# Patient Record
Sex: Female | Born: 1965 | Race: White | Hispanic: No | Marital: Married | Smoking: Never smoker
Health system: Southern US, Community
[De-identification: ages and names within clinical notes are randomized; demographics above are authoritative.]

## PROBLEM LIST (undated history)

## (undated) DIAGNOSIS — D509 Iron deficiency anemia, unspecified: Secondary | ICD-10-CM

## (undated) DIAGNOSIS — K449 Diaphragmatic hernia without obstruction or gangrene: Secondary | ICD-10-CM

## (undated) DIAGNOSIS — E538 Deficiency of other specified B group vitamins: Secondary | ICD-10-CM

## (undated) DIAGNOSIS — D72819 Decreased white blood cell count, unspecified: Secondary | ICD-10-CM

## (undated) DIAGNOSIS — IMO0001 Reserved for inherently not codable concepts without codable children: Secondary | ICD-10-CM

## (undated) DIAGNOSIS — K219 Gastro-esophageal reflux disease without esophagitis: Secondary | ICD-10-CM

## (undated) DIAGNOSIS — E282 Polycystic ovarian syndrome: Secondary | ICD-10-CM

## (undated) HISTORY — DX: Diaphragmatic hernia without obstruction or gangrene: K44.9

## (undated) HISTORY — DX: Decreased white blood cell count, unspecified: D72.819

## (undated) HISTORY — DX: Deficiency of other specified B group vitamins: E53.8

## (undated) HISTORY — DX: Iron deficiency anemia, unspecified: D50.9

## (undated) HISTORY — DX: Polycystic ovarian syndrome: E28.2

## (undated) HISTORY — PX: TONSILLECTOMY: SUR1361

---

## 1993-07-17 HISTORY — PX: TMJ ARTHROSCOPY: SHX1067

## 2000-04-09 ENCOUNTER — Ambulatory Visit (HOSPITAL_COMMUNITY): Admission: RE | Admit: 2000-04-09 | Discharge: 2000-04-09 | Payer: Self-pay | Admitting: Obstetrics and Gynecology

## 2000-04-09 ENCOUNTER — Encounter: Payer: Self-pay | Admitting: Obstetrics and Gynecology

## 2005-11-10 ENCOUNTER — Ambulatory Visit (HOSPITAL_COMMUNITY): Admission: RE | Admit: 2005-11-10 | Discharge: 2005-11-10 | Payer: Self-pay | Admitting: Gynecology

## 2005-11-10 ENCOUNTER — Ambulatory Visit: Payer: Self-pay | Admitting: Gynecology

## 2005-11-27 ENCOUNTER — Ambulatory Visit: Payer: Self-pay | Admitting: Gynecology

## 2005-12-18 ENCOUNTER — Ambulatory Visit: Payer: Self-pay | Admitting: Gynecology

## 2005-12-19 ENCOUNTER — Ambulatory Visit: Payer: Self-pay | Admitting: Gynecology

## 2006-01-22 ENCOUNTER — Ambulatory Visit: Payer: Self-pay | Admitting: Gynecology

## 2006-02-19 ENCOUNTER — Ambulatory Visit: Payer: Self-pay | Admitting: Gynecology

## 2006-02-19 ENCOUNTER — Ambulatory Visit (HOSPITAL_COMMUNITY): Admission: RE | Admit: 2006-02-19 | Discharge: 2006-02-19 | Payer: Self-pay | Admitting: Gynecology

## 2006-03-12 ENCOUNTER — Ambulatory Visit: Payer: Self-pay | Admitting: Gynecology

## 2006-03-29 ENCOUNTER — Ambulatory Visit: Payer: Self-pay | Admitting: Obstetrics & Gynecology

## 2006-04-02 ENCOUNTER — Ambulatory Visit: Payer: Self-pay | Admitting: Obstetrics & Gynecology

## 2006-04-17 ENCOUNTER — Ambulatory Visit: Payer: Self-pay | Admitting: Family Medicine

## 2006-05-01 ENCOUNTER — Ambulatory Visit: Payer: Self-pay | Admitting: Family Medicine

## 2006-05-10 ENCOUNTER — Ambulatory Visit (HOSPITAL_COMMUNITY): Admission: RE | Admit: 2006-05-10 | Discharge: 2006-05-10 | Payer: Self-pay | Admitting: Gynecology

## 2006-05-15 ENCOUNTER — Ambulatory Visit: Payer: Self-pay | Admitting: Family Medicine

## 2006-05-29 ENCOUNTER — Ambulatory Visit: Payer: Self-pay | Admitting: Family Medicine

## 2006-06-12 ENCOUNTER — Ambulatory Visit: Payer: Self-pay | Admitting: Family Medicine

## 2006-06-19 ENCOUNTER — Ambulatory Visit: Payer: Self-pay | Admitting: Family Medicine

## 2006-06-26 ENCOUNTER — Ambulatory Visit: Payer: Self-pay | Admitting: Family Medicine

## 2006-06-29 ENCOUNTER — Inpatient Hospital Stay (HOSPITAL_COMMUNITY): Admission: AD | Admit: 2006-06-29 | Discharge: 2006-07-02 | Payer: Self-pay | Admitting: Family Medicine

## 2006-06-29 ENCOUNTER — Ambulatory Visit: Payer: Self-pay | Admitting: Family Medicine

## 2006-08-14 ENCOUNTER — Ambulatory Visit: Payer: Self-pay | Admitting: Family Medicine

## 2007-07-18 HISTORY — PX: ESOPHAGOGASTRODUODENOSCOPY: SHX1529

## 2007-07-18 HISTORY — PX: COLONOSCOPY: SHX174

## 2007-10-31 ENCOUNTER — Ambulatory Visit: Payer: Self-pay | Admitting: Unknown Physician Specialty

## 2007-11-15 ENCOUNTER — Ambulatory Visit: Payer: Self-pay | Admitting: Internal Medicine

## 2007-12-16 ENCOUNTER — Ambulatory Visit: Payer: Self-pay | Admitting: Internal Medicine

## 2008-01-15 ENCOUNTER — Ambulatory Visit: Payer: Self-pay | Admitting: Internal Medicine

## 2008-01-29 HISTORY — PX: BONE MARROW BIOPSY: SHX199

## 2008-02-15 ENCOUNTER — Ambulatory Visit: Payer: Self-pay | Admitting: Internal Medicine

## 2008-04-07 ENCOUNTER — Ambulatory Visit: Payer: Self-pay | Admitting: Internal Medicine

## 2008-04-16 ENCOUNTER — Ambulatory Visit: Payer: Self-pay | Admitting: Internal Medicine

## 2008-04-19 ENCOUNTER — Ambulatory Visit: Payer: Self-pay | Admitting: Family Medicine

## 2008-09-14 ENCOUNTER — Ambulatory Visit: Payer: Self-pay | Admitting: Internal Medicine

## 2008-09-28 ENCOUNTER — Ambulatory Visit: Payer: Self-pay | Admitting: Internal Medicine

## 2008-10-15 ENCOUNTER — Ambulatory Visit: Payer: Self-pay | Admitting: Internal Medicine

## 2008-11-14 ENCOUNTER — Ambulatory Visit: Payer: Self-pay | Admitting: Internal Medicine

## 2008-11-25 ENCOUNTER — Ambulatory Visit: Payer: Self-pay | Admitting: Internal Medicine

## 2008-12-15 ENCOUNTER — Ambulatory Visit: Payer: Self-pay | Admitting: Internal Medicine

## 2009-01-06 IMAGING — US TRANSABDOMINAL ULTRASOUND OF PELVIS
1 series · 17 of 25 positions shown · non-contrast
Comparison: none

REASON FOR EXAM: right adnexal mass seen on CT
COMMENTS:

[Series 1: transabdominal ultrasound of pelvis · 17 of 34 slices shown]
[im 1/34]
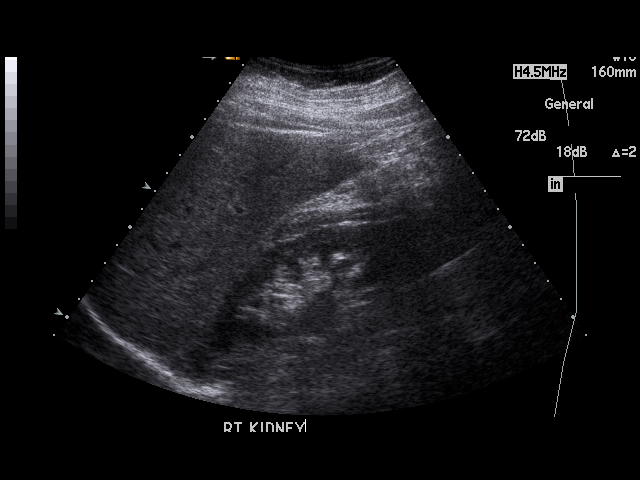
[im 3/34]
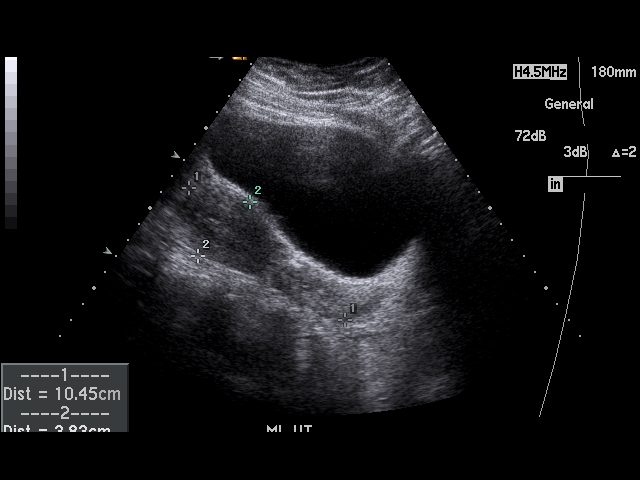
[im 5/34]
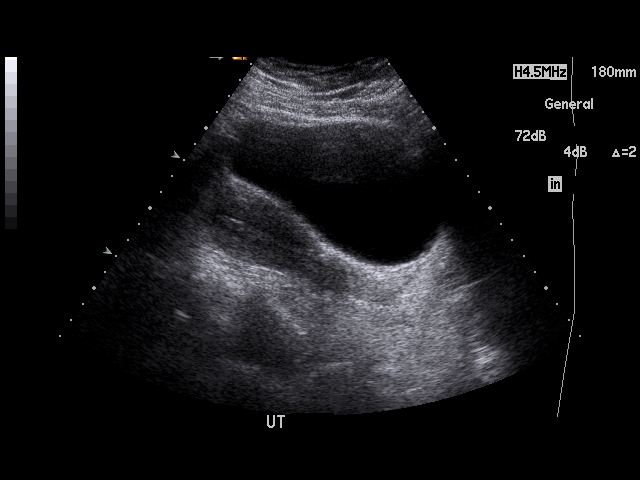
[im 7/34]
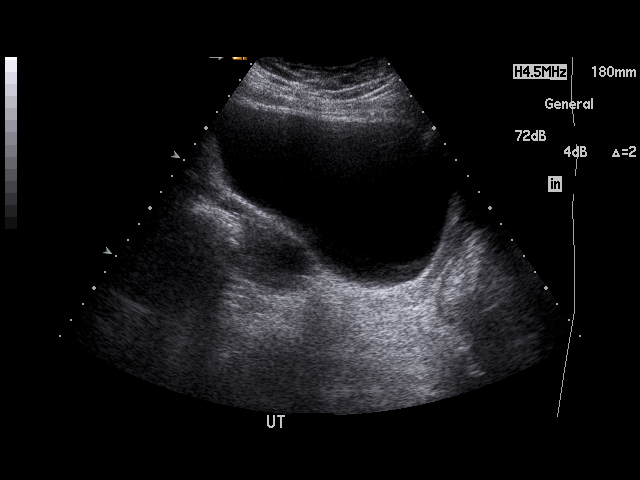
[im 9/34]
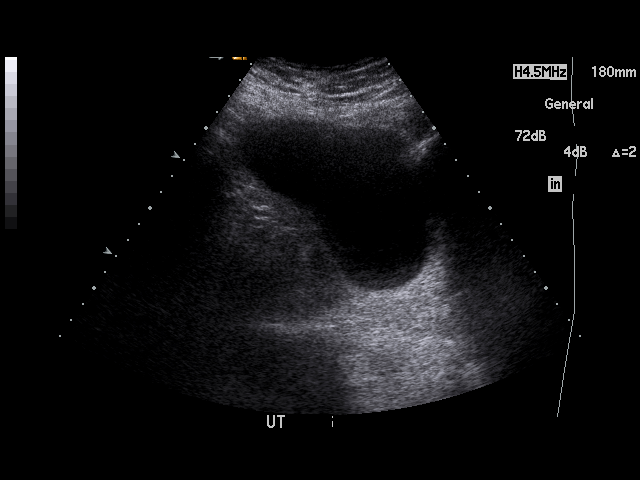
[im 12/34]
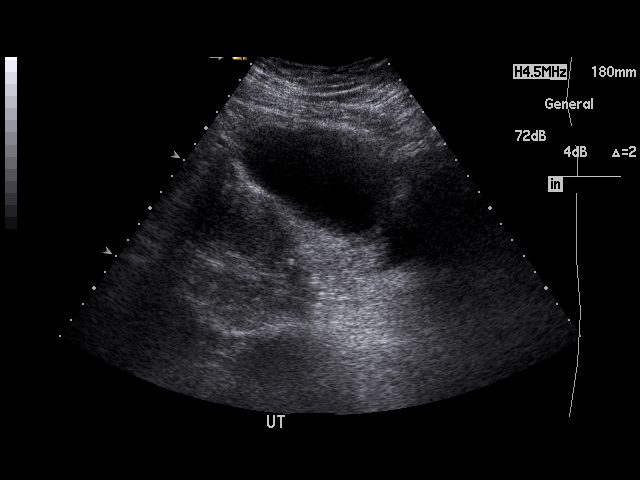
[im 13/34]
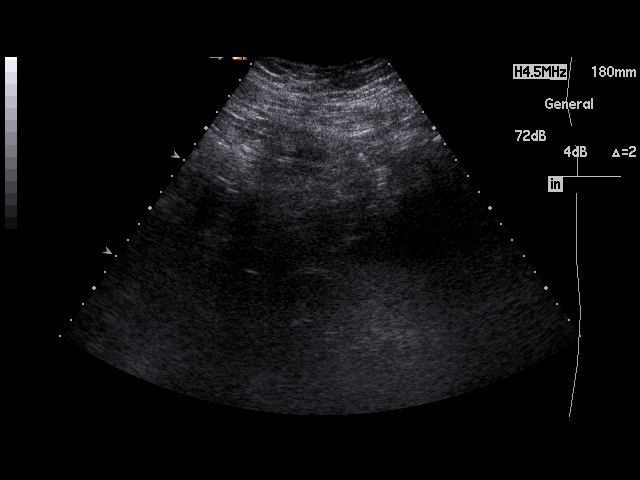
[im 16/34]
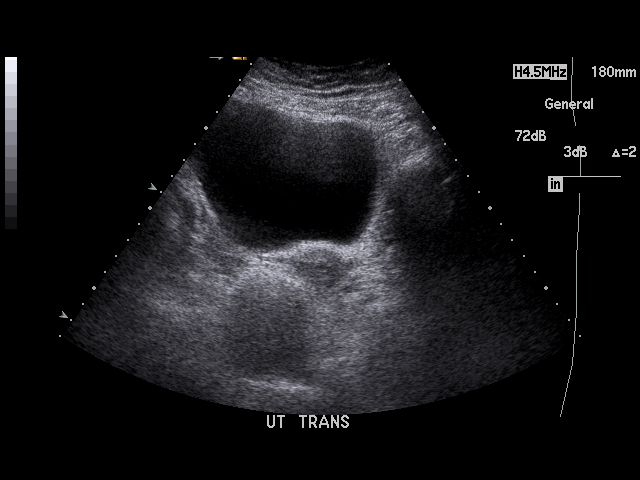
[im 17/34]
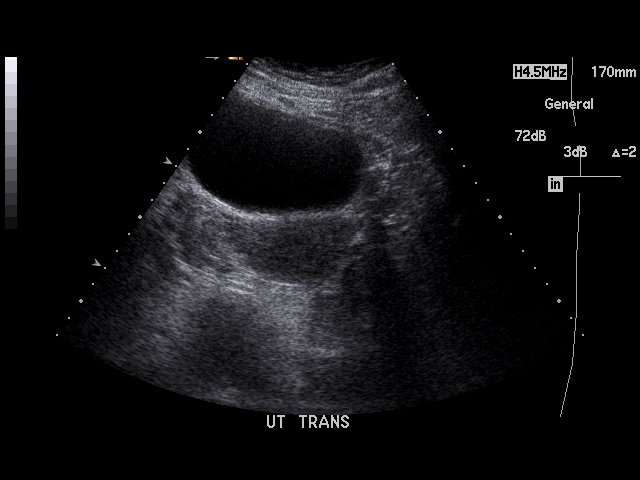
[im 18/34]
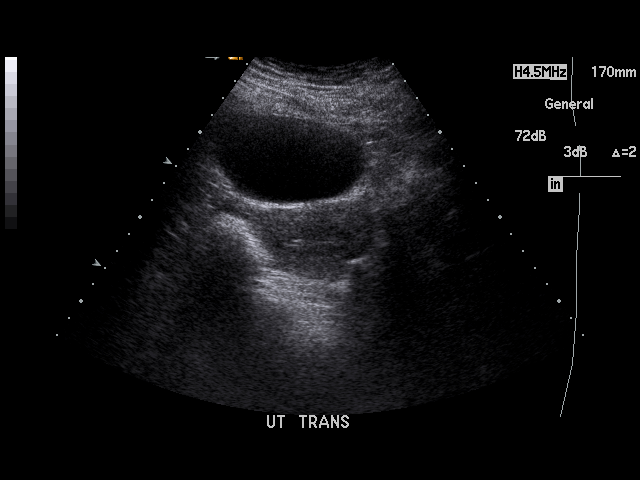
[im 21/34]
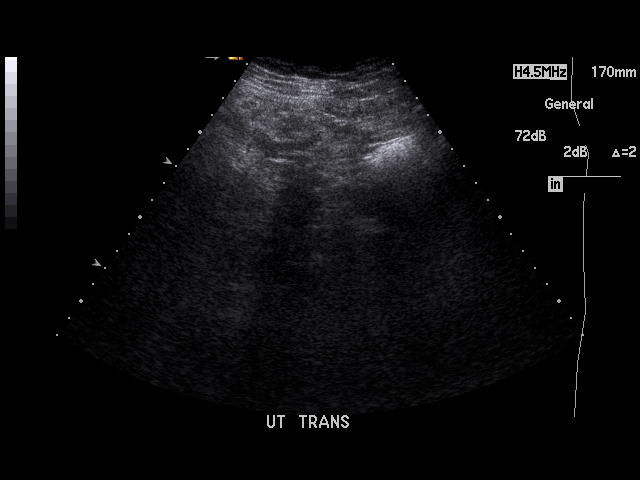
[im 23/34]
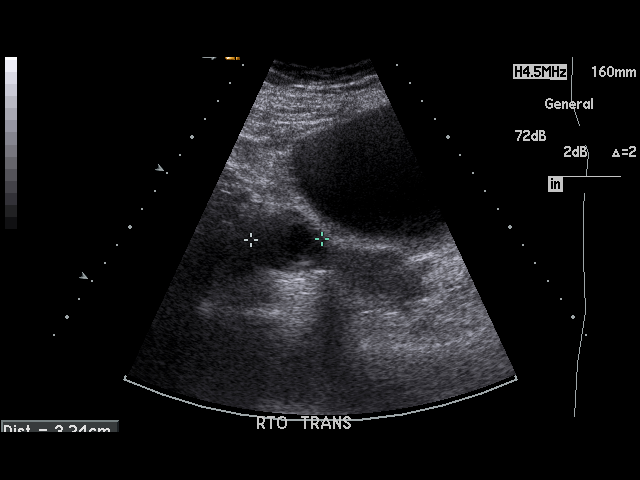
[im 25/34]
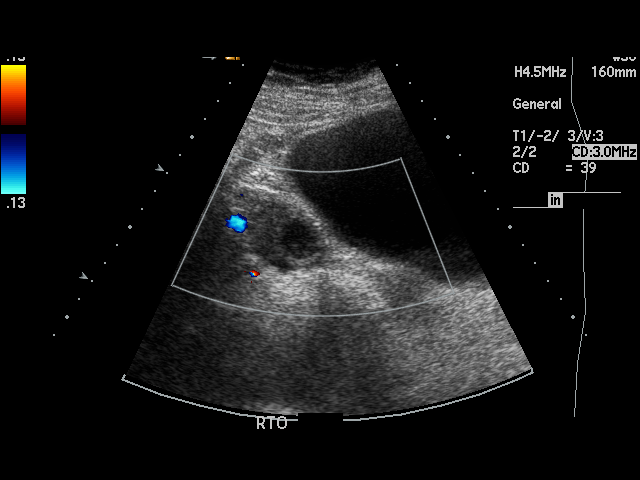
[im 27/34]
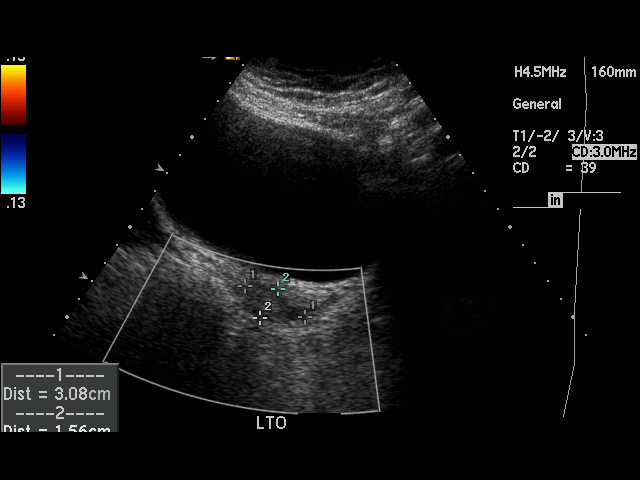
[im 29/34]
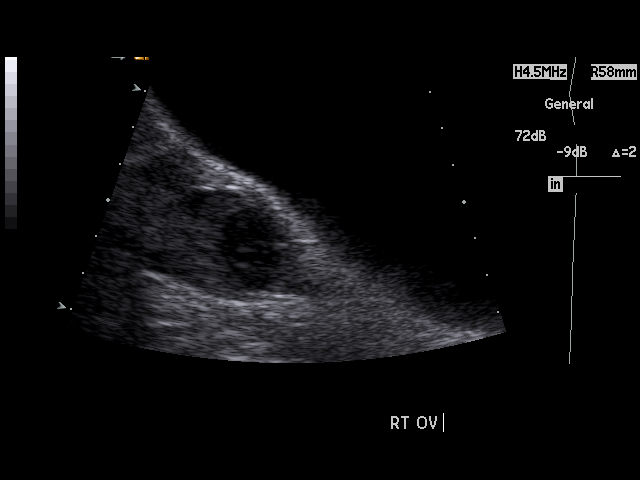
[im 31/34]
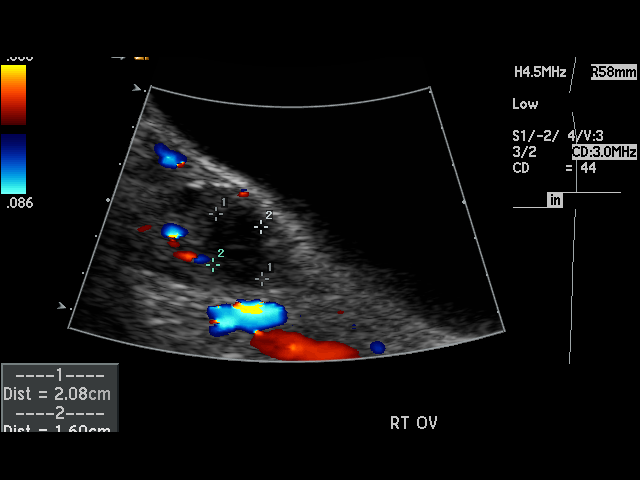
[im 34/34]
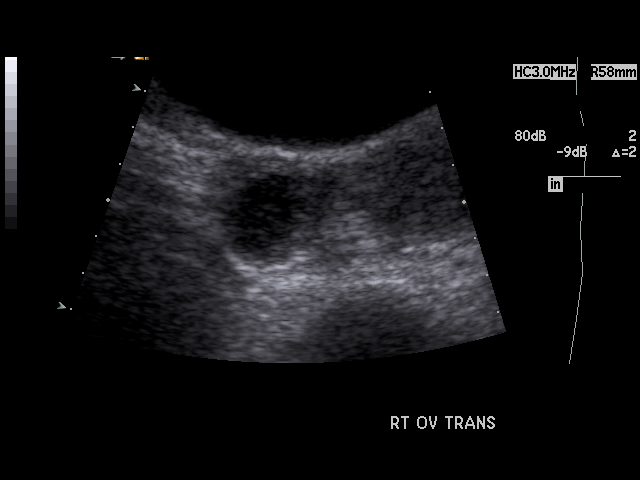

[17 of 25 positions shown; findings below may reference images not displayed]

PROCEDURE:     US  - US PELVIS MASS EXAM  - [DATE] [DATE] [DATE]  [DATE]

RESULT:     Pelvic sonogram performed transabdominally demonstrates the
uterus measures 10.5 x 3.8 x 5.1 cm. The endometrial stripe thickness is
mm. No definite mass is seen within the uterus. There is blood flow to both
ovaries. The RIGHT ovary is enlarged to 4.3 x 2.6 x 3.2 cm. The LEFT ovary
measures 3.1 x 1.6 x 3.2 cm. There is a roughly 2.0 x 1.6 x 1.5 cm lesion
involving the RIGHT ovary containing some low level internal echoes
suggestive of a possible hemorrhagic cyst. There is no evidence of adnexal
mass or significant free fluid.
IMPRESSION: 1.Possible hemorrhagic cyst in the RIGHT ovary. The uterus is slightly
prominent.

## 2009-01-14 ENCOUNTER — Ambulatory Visit: Payer: Self-pay | Admitting: Internal Medicine

## 2009-02-09 ENCOUNTER — Ambulatory Visit: Payer: Self-pay | Admitting: Obstetrics & Gynecology

## 2009-02-09 ENCOUNTER — Encounter: Payer: Self-pay | Admitting: Obstetrics & Gynecology

## 2009-02-14 ENCOUNTER — Ambulatory Visit: Payer: Self-pay | Admitting: Internal Medicine

## 2009-03-17 ENCOUNTER — Ambulatory Visit: Payer: Self-pay | Admitting: Internal Medicine

## 2009-04-16 ENCOUNTER — Ambulatory Visit: Payer: Self-pay | Admitting: Internal Medicine

## 2009-05-17 ENCOUNTER — Ambulatory Visit: Payer: Self-pay | Admitting: Internal Medicine

## 2009-07-14 ENCOUNTER — Ambulatory Visit: Payer: Self-pay | Admitting: Internal Medicine

## 2009-07-17 ENCOUNTER — Ambulatory Visit: Payer: Self-pay | Admitting: Internal Medicine

## 2009-08-17 ENCOUNTER — Ambulatory Visit: Payer: Self-pay | Admitting: Internal Medicine

## 2009-09-09 ENCOUNTER — Ambulatory Visit: Payer: Self-pay | Admitting: Internal Medicine

## 2009-09-14 ENCOUNTER — Ambulatory Visit: Payer: Self-pay | Admitting: Internal Medicine

## 2009-10-27 ENCOUNTER — Ambulatory Visit: Payer: Self-pay | Admitting: Internal Medicine

## 2010-08-31 ENCOUNTER — Ambulatory Visit: Payer: Self-pay | Admitting: Internal Medicine

## 2010-09-05 ENCOUNTER — Ambulatory Visit: Payer: Self-pay | Admitting: Unknown Physician Specialty

## 2010-09-15 ENCOUNTER — Ambulatory Visit: Payer: Self-pay | Admitting: Internal Medicine

## 2010-10-06 ENCOUNTER — Ambulatory Visit: Payer: Self-pay | Admitting: Unknown Physician Specialty

## 2010-10-16 ENCOUNTER — Ambulatory Visit: Payer: Self-pay | Admitting: Internal Medicine

## 2010-11-15 ENCOUNTER — Ambulatory Visit: Payer: Self-pay | Admitting: Internal Medicine

## 2010-11-29 NOTE — Assessment & Plan Note (Signed)
NAME:  Leslie Berg, BULTEMA               ACCOUNT NO.:  [id number]   MEDICAL RECORD NO.:  [id number]          PATIENT TYPE:  POB   LOCATION:  [facility] at [facility]         FACILITY:  [facility]   PHYSICIAN:  Jaynie Collins, MD     DATE OF BIRTH:  April 12, 1966   DATE OF SERVICE:                                  CLINIC NOTE   The patient comes to the office today for 2 problems; the first is of  irregular menstrual cycle and the second is that of some knots under her  left armpit.  We will start with the irregular menstrual cycles.  She  has a very long history of PCOS.  She was diagnosed in 2001.  She was on  Glucophage at one point.  She has also been on Clomid several times.  She does have infertility issues.  She did  have a child 2-1/2 years  ago.  She is currently using condoms for birth control.  She describes  her menstrual cycle prior to the last 2 months as coming every 6 weeks.  She has now states that her menstrual cycle is every 3 weeks for the  last 2 months only.  Her cycle last for 5-6 days.  She has 1-2 days of  heavy bleeding or she changes her pads 3-4 times per day.  She has 2  days of moderate bleeding, where she changes her pad 2-3 times a day and  she has 2 mild days when she changes her pad once or twice a day more  for hygiene.  Again this has only gone on for the last 2 months.  In the  past year, she has had an extensive workup for anemia.  She has had a  complete GI workup from the [facility] as well as a CT and  ultrasound of her abdomen and pelvis.  She has been given approximately  10 iron infusions.  Her hemoglobin has risen somewhat.  There has been  no clear-cut explanation for the anemia.  In the meantime, she has not  had a Pap smears in 2007.  She has never had an abnormal Pap smear.  The  second problem is that of these knots have developed under her arm.  She  is also having some of these areas of infection and other parts of her  body.  She has had them on  her arms and on her legs.  She is currently  seeing a Infectious Disease doctor for this.  It was not diagnosed as  MRSA, but diagnosed as a staph infection.  She currently does not have  these areas under her arm or any place on her skin at this point.   PHYSICAL EXAMINATION:  GENERAL:  Well-developed, well-nourished,  overweight 45 year old Caucasian female in no acute distress.  VITAL SIGNS:  Blood pressure is 122/83, pulse is 73, weight is 191,  height is 5 feet 1.  BREASTS:  Symmetrical.  No skin dimpling.  No mass appreciated.  No  axillae or lymphadenopathy or break in skin.  PELVIC:  Internally, the vault has a very small amount of blood.  The  cervix is closed.  She has a  nullip cervix.  She has no cervical motion  tenderness.  ABDOMEN:  Quite obese, but there was no cervical motion tenderness or  adnexal mass appreciated.   ASSESSMENT:  1. Irregular menstrual cycle.  2. Infections on the skin.   PLAN:  Today is to discuss this with Dr. Macon Large and she felt that it  would be prudent to offer this lady a birth control that may help to  suppress her menstrual cycle in the event that this patient feels that  the menstrual cycle is heavy and bothersome.  The patient does not feel  the pad is necessary at this time.  We will also do her Pap smear  today as well as her screening mammogram.  She has not had a mammogram.  The patient is asked to return if her menstrual cycle worsens or if  there are any further problems.  Otherwise, she will return in 1 year.      [name], NP    ______________________________  Jaynie Collins, MD    LR/MEDQ  D:  02/09/2009  T:  02/10/2009  Job:  [id number]

## 2010-12-02 NOTE — Discharge Summary (Signed)
NAMEYIDES, Leslie Berg               ACCOUNT NO.:  [id number]   MEDICAL RECORD NO.:  [id number]          PATIENT TYPE:  INP   LOCATION:  9107                          FACILITY:  [facility]   PHYSICIAN:  Levander Campion, M.D.  DATE OF BIRTH:  07/14/1966   DATE OF ADMISSION:  06/29/2006  DATE OF DISCHARGE:  07/02/2006                               DISCHARGE SUMMARY   DISCHARGE DIAGNOSES:  1. Status post repeat, low transverse cesarean section.  2. Delivery of a viable female infant.  3. Advanced maternal age.   DISCHARGE MEDICATIONS:  1. Percocet 5/325 one to two tabs p.o. q.4-6 h. p.r.n. pain.  2. Ibuprofen 600 mg 1 tab q.6 h. p.r.n. pain.  3. Prenatal vitamins 1 p.o. daily while breast feeding.  4. Colace 100 mg p.o. b.i.d. p.r.n. for postpartum constipation.   DISCHARGE LABS:  Hemoglobin was 8.6, hematocrit 24.6 and that is  interoperative, from preop H&H 9.6/27.7.  The patient's blood type is A  positive, antibody negative, and the patient is rubella immune.   HOSPITAL COURSE:  Ms. Sobh is a 45 year old G8 P1-0-6-1 who was  admitted for scheduled repeat low transverse cesarean section.  Please  see operative report for details. Her postop course was unremarkable.  Her hemoglobin did drop from 9.6 to 8.6; however, she was asymptomatic  throughout her postop course reporting no dizziness, lightheadness or  weakness.  She is breast feeding successfully.  Her infant received a  circumcision.  She is undecided on what she would like to use for  contraception.  She will decide that at her 6-week postpartum  appointment at [facility].  Otherwise postop course unremarkable with  no complications.   DISCHARGE MEDICATIONS:  The patient was discharged home with adequate  pain medication, prenatal vitamins, and Colace.   FOLLOWUP:  1. She is to followup in 6 weeks at [facility] for postpartum      appointment.  She is call the [facility] for an      appointment to have her  staples removed in two days on postop day      #5.  She is to have pelvic rest x6 weeks and no heavy lifting x6      weeks.  2. The patient is to followup in 6 weeks with [facility] for      postpartum visit and she is to call the [facility] in 2      days for staple removal.           ______________________________  Levander Campion, M.D.     JH/MEDQ  D:  07/02/2006  T:  07/02/2006  Job:  [id number]

## 2010-12-02 NOTE — Op Note (Signed)
NAMESADAKO, CEGIELSKI               ACCOUNT NO.:  [id number]   MEDICAL RECORD NO.:  [id number]          PATIENT TYPE:  INP   LOCATION:                                FACILITY:  [facility]   PHYSICIAN:  Tanya S. Shawnie Pons, M.D.   DATE OF BIRTH:  June 09, 1966   DATE OF PROCEDURE:  06/29/2006  DATE OF DISCHARGE:                               OPERATIVE REPORT   PREOPERATIVE DIAGNOSIS:  Intrauterine pregnancy at 39 weeks, previous  cesarean section.   POSTOPERATIVE DIAGNOSIS:  Intrauterine pregnancy at 39 weeks, previous  cesarean section.   PROCEDURE:  Repeat low transverse Cesarean section.   SURGEON:  Shelbie Proctor. Shawnie Pons, M.D.   ASSISTANT:  Paticia Stack, M.D.   ANESTHESIA:  Spinal.   SPECIMENS:  Placenta sent to labor and delivery.   ESTIMATED BLOOD LOSS:  750 mL.   COMPLICATIONS:  None.   FINDINGS:  Viable female infant, Apgars 9 and 9 at one and five minutes,  respectively. Birth weight 8 pounds 14 ounces.   REASON FOR PROCEDURE:  This is a 45 year old gravida 8, para 1-0-6-1 at  39-weeks' gestation who describes an elective repeat cesarean section.   PROCEDURE:  The patient was taken to the operating room where her spinal  anesthesia was found to be adequate. She was then prepped and draped in  a normal sterile fashion in a dorsal supine position with a leftward  tilt ___________ skin incision was then made with a scalpel and carried  through to the underlying layer of fascia. The fascia was incised in the  midline and the incision extended laterally with a Mayo scissors. The  superior aspect of the fascial incision was then grasped with a Kocher  clamp and elevated, and the underlying rectus muscles dissected off  bluntly. There are a lot of adhesions noted and dissected. Attention was  then turned to the inferior aspect of the incision which in a similar  fashion was grasped, tented up with the Kocher clamp, and the rectus  muscle was dissected off bluntly. The rectus muscles were  separated in  the midline. The peritoneum was identified, tented up and entered with  the Metzenbaum scissors. There were again noted to be a lot of  adhesions, and the bladder was found to be quite high. The adhesions  were dissected, and the bladder blade was inserted. The lower uterine  segment was identified and noted to be very thin. It was incised in a  transfer fashion with a scalpel. The bladder blade was removed, and the  infant's head delivered atraumatically. The nose and mouth were  suctioned, and the cord clamped and cut. The infant was handed off to  the waiting pediatrician. Cord blood was obtained. The placenta was then  removed manually, the uterus exteriorized and cleared of all clots and  debris. The uterine incision was repaired with 1-0 chromic in a running  locked fashion. The uterus was returned to the abdomen, and the incision  examined for signs of bleeding. Pressure was applied to the uterine  incision with good hemostasis. The gutters were cleared of all  clots and debris, and the fascia was reapproximated with 0 Vicryl in a  running fashion. The skin was closed with staples. The patient tolerated  the procedure well. Sponge, lap and needle counts were correct x2. One  gram of Ancef was given after cord clamp. The patient was taken to the  recovery room in stable condition.     ______________________________  Paticia Stack, MD    ______________________________  Shelbie Proctor. Shawnie Pons, M.D.    LNJ/MEDQ  D:  06/29/2006  T:  06/29/2006  Job:  [id number]

## 2010-12-02 NOTE — Assessment & Plan Note (Signed)
NAME:  MALEAHA, HUGHETT NO.:  [id number]   MEDICAL RECORD NO.:  [id number]          PATIENT TYPE:  POB   LOCATION:  [facility]         FACILITY:  [facility]   PHYSICIAN:  Tinnie Gens, MD        DATE OF BIRTH:  06-24-1966   DATE OF SERVICE:                                  CLINIC NOTE   Ms. Visscher is a 45 year old female, gravida 8, AB 6, para 2, who  presents for her postpartum examination.  She delivered by C-section,  repeat, on June 29, 2006, by Dr. Shawnie Pons.  She delivered a viable female  whose name is [employer] cabin [employer].  She reports that things are going quite well as far  as she is concerned.  She says she is not having any depression and is  happy.  She states she is no longer bleeding.  She has not attempted  intercourse at this point.  The baby is a little colicky and is being  followed by [facility] for not quite back up to birth weight.  His birth weight was approximately 8 1/2 pounds.  Her weight, however,  is only 3 pounds from her pre-pregnant weight.   PHYSICAL EXAMINATION:  Her abdomen is soft and tender, well healed  incision.  Pelvic exam reveals external genitalia within normal limits  for female.  Vagina is clean and rugose.  The cervix is multiparous and  clean.  The uterus is back to normal size, shape, and contour.  The  adnexa are bilaterally clear.  There does not seem to be any problems  with hemorrhoids.   ASSESSMENT:  Normal postpartum exam.   PLAN:  She is to continue her prenatal vitamins as long as she nurses.  Pamphlets were given on both the [place] and the ParaGard IUD for  consideration at the time of her annual exam and Pap smear which are due  in May 2008.  She is to call for any problems or consideration of  earlier than stated insertion of her IUD.  In the meantime, she will use  foam and condoms for birth control.     ______________________________  Matt Holmes, N.P.    ______________________________  Tinnie Gens,  MD    EMK/MEDQ  D:  08/14/2006  T:  08/14/2006  Job:  [id number]

## 2010-12-19 ENCOUNTER — Inpatient Hospital Stay: Payer: Self-pay | Admitting: Internal Medicine

## 2010-12-19 ENCOUNTER — Ambulatory Visit: Payer: Self-pay | Admitting: Unknown Physician Specialty

## 2010-12-20 LAB — PATHOLOGY REPORT

## 2011-02-15 ENCOUNTER — Ambulatory Visit: Payer: Self-pay | Admitting: Internal Medicine

## 2011-03-18 ENCOUNTER — Ambulatory Visit: Payer: Self-pay | Admitting: Internal Medicine

## 2011-06-07 ENCOUNTER — Ambulatory Visit: Payer: Self-pay | Admitting: Internal Medicine

## 2011-06-17 ENCOUNTER — Ambulatory Visit: Payer: Self-pay | Admitting: Internal Medicine

## 2011-10-10 ENCOUNTER — Ambulatory Visit: Payer: Self-pay | Admitting: Internal Medicine

## 2011-10-10 LAB — CBC CANCER CENTER
Basophil #: 0 x10 3/mm (ref 0.0–0.1)
Basophil %: 0.7 %
Eosinophil #: 0.1 x10 3/mm (ref 0.0–0.7)
Eosinophil %: 2 %
HCT: 37.4 % (ref 35.0–47.0)
HGB: 13.2 g/dL (ref 12.0–16.0)
Lymphocyte #: 1.8 x10 3/mm (ref 1.0–3.6)
Lymphocyte %: 34.7 %
MCH: 30.5 pg (ref 26.0–34.0)
MCHC: 35.3 g/dL (ref 32.0–36.0)
MCV: 86 fL (ref 80–100)
Monocyte #: 0.4 x10 3/mm (ref 0.0–0.7)
Monocyte %: 7 %
Neutrophil #: 2.9 x10 3/mm (ref 1.4–6.5)
Neutrophil %: 55.6 %
Platelet: 281 x10 3/mm (ref 150–440)
RBC: 4.32 10*6/uL (ref 3.80–5.20)
RDW: 13.3 % (ref 11.5–14.5)
WBC: 5.1 x10 3/mm (ref 3.6–11.0)

## 2011-10-10 LAB — IRON AND TIBC
Iron Bind.Cap.(Total): 319 ug/dL (ref 250–450)
Iron Saturation: 25 %
Iron: 80 ug/dL (ref 50–170)

## 2011-10-10 LAB — FERRITIN: Ferritin (ARMC): 60 ng/mL (ref 8–388)

## 2011-10-16 ENCOUNTER — Ambulatory Visit: Payer: Self-pay | Admitting: Internal Medicine

## 2012-09-06 ENCOUNTER — Ambulatory Visit: Payer: Self-pay | Admitting: Internal Medicine

## 2012-09-18 ENCOUNTER — Ambulatory Visit: Payer: Self-pay | Admitting: Internal Medicine

## 2012-09-19 LAB — CBC CANCER CENTER
Basophil #: 0 x10 3/mm (ref 0.0–0.1)
Eosinophil #: 0.1 x10 3/mm (ref 0.0–0.7)
Eosinophil %: 1.4 %
Lymphocyte #: 1.5 x10 3/mm (ref 1.0–3.6)
Lymphocyte %: 28.6 %
MCV: 81 fL (ref 80–100)
Monocyte #: 0.3 x10 3/mm (ref 0.2–0.9)
Monocyte %: 5.2 %
Neutrophil %: 64.3 %
Platelet: 214 x10 3/mm (ref 150–440)
RBC: 4.28 10*6/uL (ref 3.80–5.20)
RDW: 14.3 % (ref 11.5–14.5)
WBC: 5.3 x10 3/mm (ref 3.6–11.0)

## 2012-09-19 LAB — IRON AND TIBC
Iron Bind.Cap.(Total): 371 ug/dL (ref 250–450)
Iron Saturation: 17 %
Iron: 62 ug/dL (ref 50–170)
Unbound Iron-Bind.Cap.: 309 ug/dL

## 2012-10-15 ENCOUNTER — Ambulatory Visit: Payer: Self-pay | Admitting: Internal Medicine

## 2013-01-15 ENCOUNTER — Ambulatory Visit: Payer: Self-pay | Admitting: Internal Medicine

## 2013-05-08 ENCOUNTER — Ambulatory Visit: Payer: Self-pay | Admitting: Internal Medicine

## 2013-05-14 ENCOUNTER — Ambulatory Visit: Payer: Self-pay | Admitting: Unknown Physician Specialty

## 2013-06-05 ENCOUNTER — Ambulatory Visit: Payer: Self-pay

## 2013-06-05 LAB — PREGNANCY, URINE: Pregnancy Test, Urine: NEGATIVE m[IU]/mL

## 2013-07-28 ENCOUNTER — Ambulatory Visit: Payer: Self-pay | Admitting: Internal Medicine

## 2013-07-28 LAB — CBC CANCER CENTER
Basophil #: 0 x10 3/mm (ref 0.0–0.1)
Basophil %: 0.8 %
EOS PCT: 1.3 %
Eosinophil #: 0.1 x10 3/mm (ref 0.0–0.7)
HCT: 29.7 % — ABNORMAL LOW (ref 35.0–47.0)
HGB: 9.2 g/dL — AB (ref 12.0–16.0)
Lymphocyte #: 1.8 x10 3/mm (ref 1.0–3.6)
Lymphocyte %: 36 %
MCH: 20.8 pg — AB (ref 26.0–34.0)
MCHC: 31.1 g/dL — AB (ref 32.0–36.0)
MCV: 67 fL — ABNORMAL LOW (ref 80–100)
MONOS PCT: 9 %
Monocyte #: 0.5 x10 3/mm (ref 0.2–0.9)
NEUTROS ABS: 2.7 x10 3/mm (ref 1.4–6.5)
Neutrophil %: 52.9 %
Platelet: 262 x10 3/mm (ref 150–440)
RBC: 4.42 10*6/uL (ref 3.80–5.20)
RDW: 16.7 % — ABNORMAL HIGH (ref 11.5–14.5)
WBC: 5.1 x10 3/mm (ref 3.6–11.0)

## 2013-07-28 LAB — IRON AND TIBC
Iron Bind.Cap.(Total): 453 ug/dL — ABNORMAL HIGH (ref 250–450)
Iron Saturation: 4 %
Iron: 20 ug/dL — ABNORMAL LOW (ref 50–170)
UNBOUND IRON-BIND. CAP.: 433 ug/dL

## 2013-07-28 LAB — FERRITIN: Ferritin (ARMC): 4 ng/mL — ABNORMAL LOW (ref 8–388)

## 2013-08-17 ENCOUNTER — Ambulatory Visit: Payer: Self-pay | Admitting: Internal Medicine

## 2013-10-23 ENCOUNTER — Ambulatory Visit: Payer: Self-pay | Admitting: Internal Medicine

## 2013-10-24 LAB — CBC CANCER CENTER
BASOS ABS: 0 x10 3/mm (ref 0.0–0.1)
Basophil %: 0.7 %
EOS ABS: 0.1 x10 3/mm (ref 0.0–0.7)
Eosinophil %: 1.3 %
HCT: 37 % (ref 35.0–47.0)
HGB: 12.5 g/dL (ref 12.0–16.0)
LYMPHS ABS: 1.4 x10 3/mm (ref 1.0–3.6)
Lymphocyte %: 26.3 %
MCH: 28.1 pg (ref 26.0–34.0)
MCHC: 33.9 g/dL (ref 32.0–36.0)
MCV: 83 fL (ref 80–100)
Monocyte #: 0.4 x10 3/mm (ref 0.2–0.9)
Monocyte %: 7.1 %
Neutrophil #: 3.5 x10 3/mm (ref 1.4–6.5)
Neutrophil %: 64.6 %
Platelet: 254 x10 3/mm (ref 150–440)
RBC: 4.46 10*6/uL (ref 3.80–5.20)
RDW: 17.2 % — AB (ref 11.5–14.5)
WBC: 5.4 x10 3/mm (ref 3.6–11.0)

## 2013-10-24 LAB — FERRITIN: Ferritin (ARMC): 19 ng/mL (ref 8–388)

## 2013-10-24 LAB — IRON AND TIBC
IRON BIND. CAP.(TOTAL): 330 ug/dL (ref 250–450)
IRON SATURATION: 15 %
Iron: 49 ug/dL — ABNORMAL LOW (ref 50–170)
UNBOUND IRON-BIND. CAP.: 281 ug/dL

## 2013-11-14 ENCOUNTER — Ambulatory Visit: Payer: Self-pay | Admitting: Internal Medicine

## 2014-02-24 ENCOUNTER — Ambulatory Visit: Payer: Self-pay | Admitting: Internal Medicine

## 2014-03-11 LAB — FERRITIN: Ferritin (ARMC): 5 ng/mL — ABNORMAL LOW (ref 8–388)

## 2014-03-11 LAB — IRON AND TIBC
IRON SATURATION: 9 %
IRON: 37 ug/dL — AB (ref 50–170)
Iron Bind.Cap.(Total): 424 ug/dL (ref 250–450)
UNBOUND IRON-BIND. CAP.: 387 ug/dL

## 2014-03-11 LAB — CANCER CENTER HEMOGLOBIN: HGB: 11.6 g/dL — AB (ref 12.0–16.0)

## 2014-03-17 ENCOUNTER — Ambulatory Visit: Payer: Self-pay | Admitting: Internal Medicine

## 2014-04-16 ENCOUNTER — Ambulatory Visit: Payer: Self-pay | Admitting: Internal Medicine

## 2014-05-06 IMAGING — CR DG CHEST 2V
1 series · 3 of 3 positions shown · non-contrast
Comparison: December 20, 2010

CLINICAL DATA: Cough

EXAM:
CHEST  2 VIEW

[Series 1: pa · 0.17mm/px · 3 of 3 slices shown]
[im 1/3]
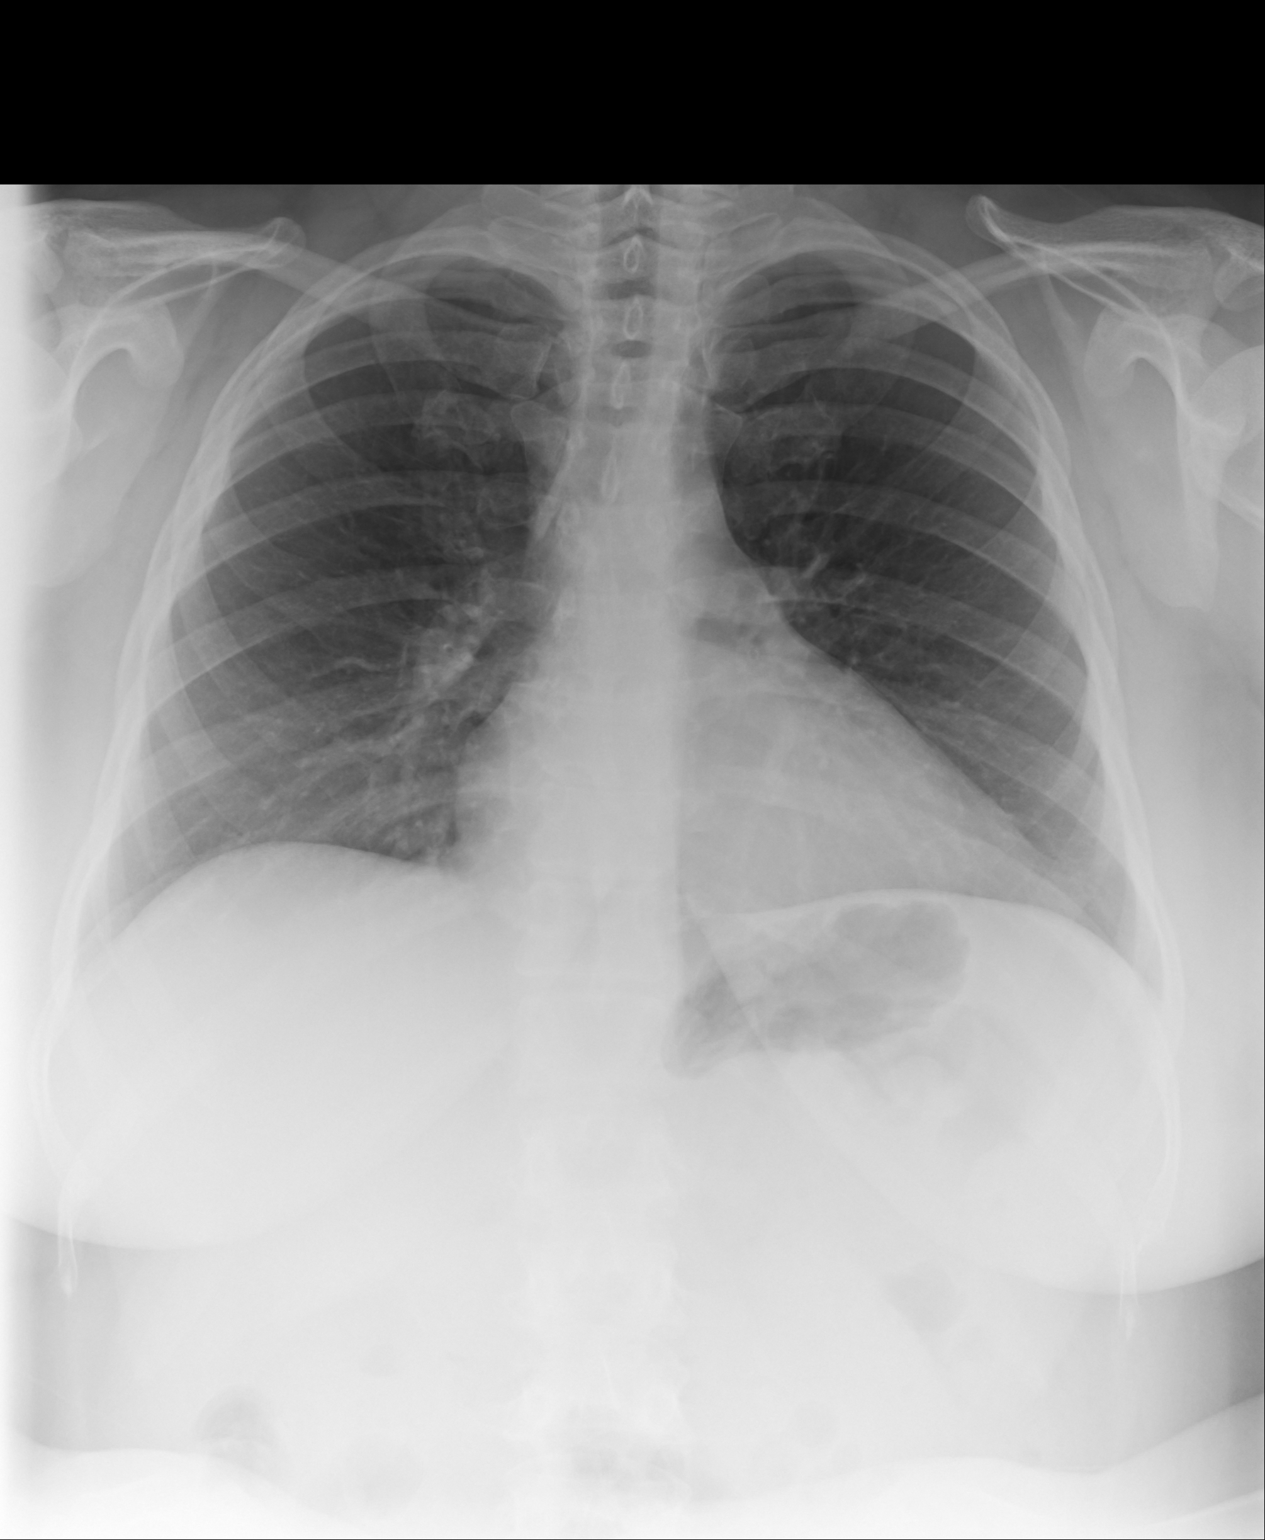
[im 2/3]
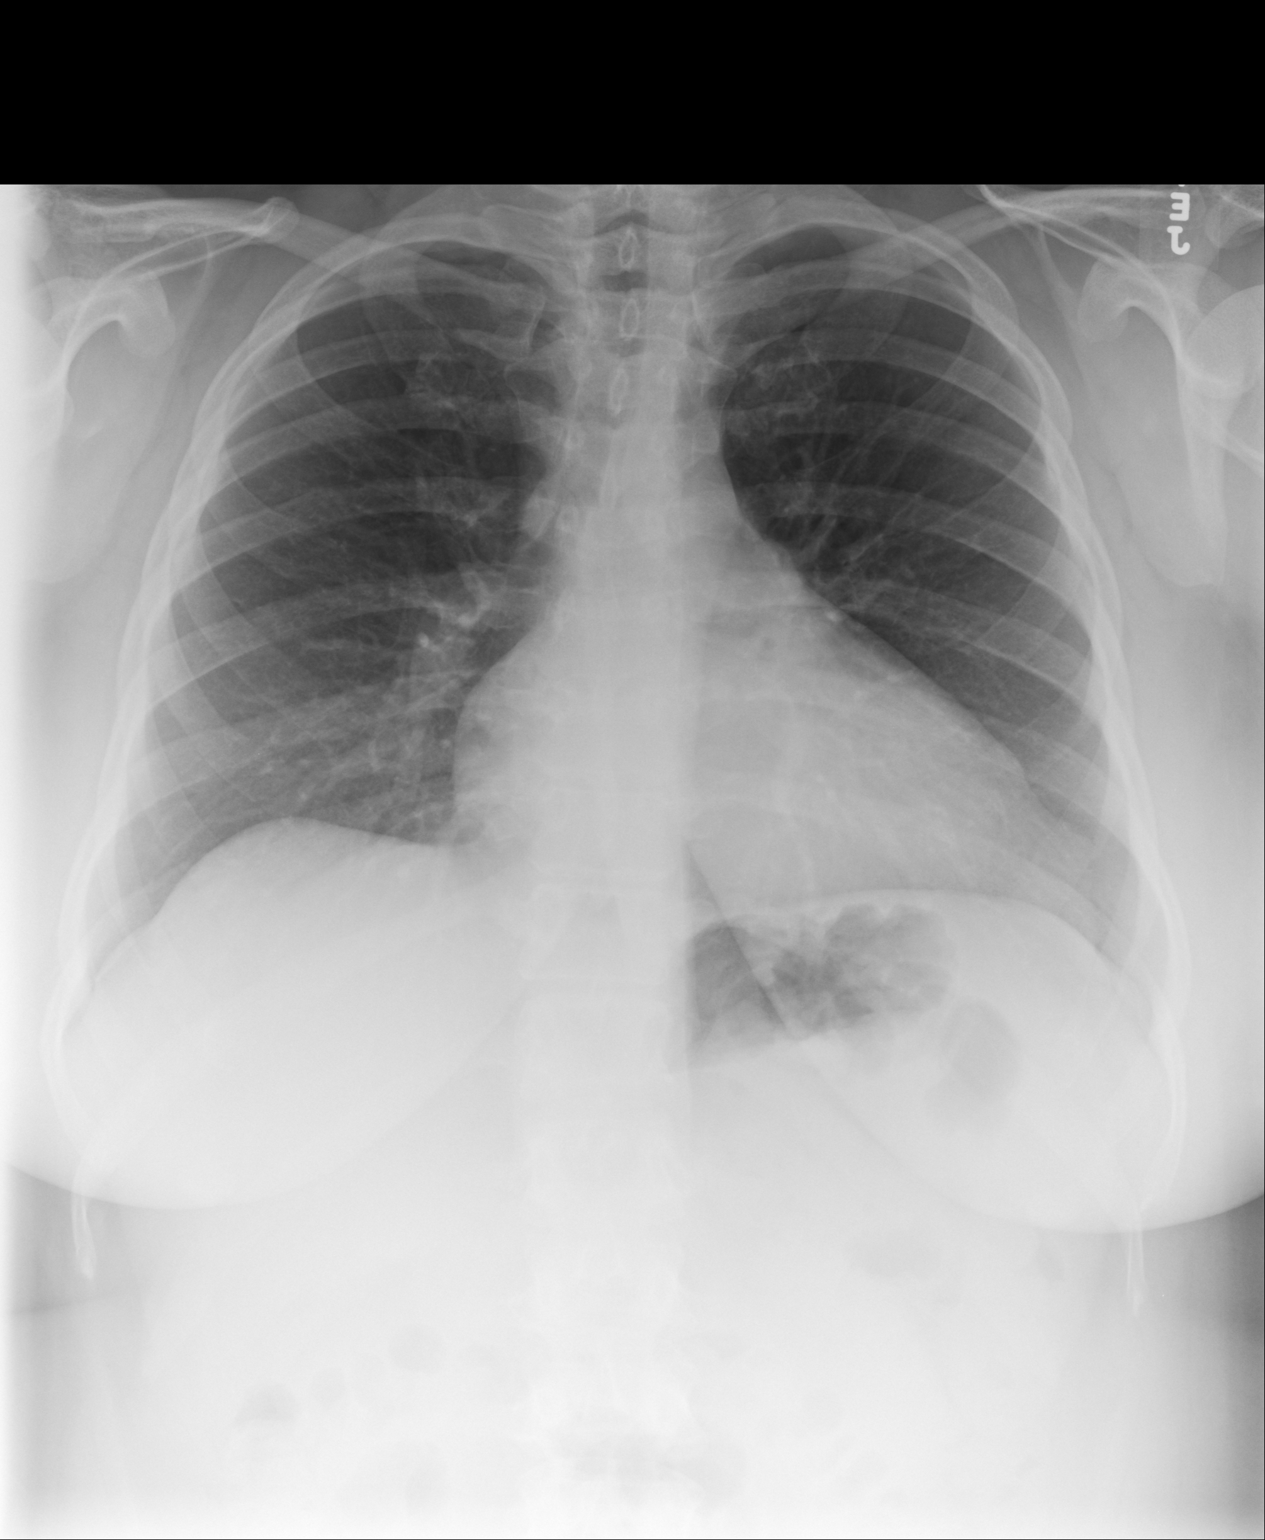
[im 3/3]
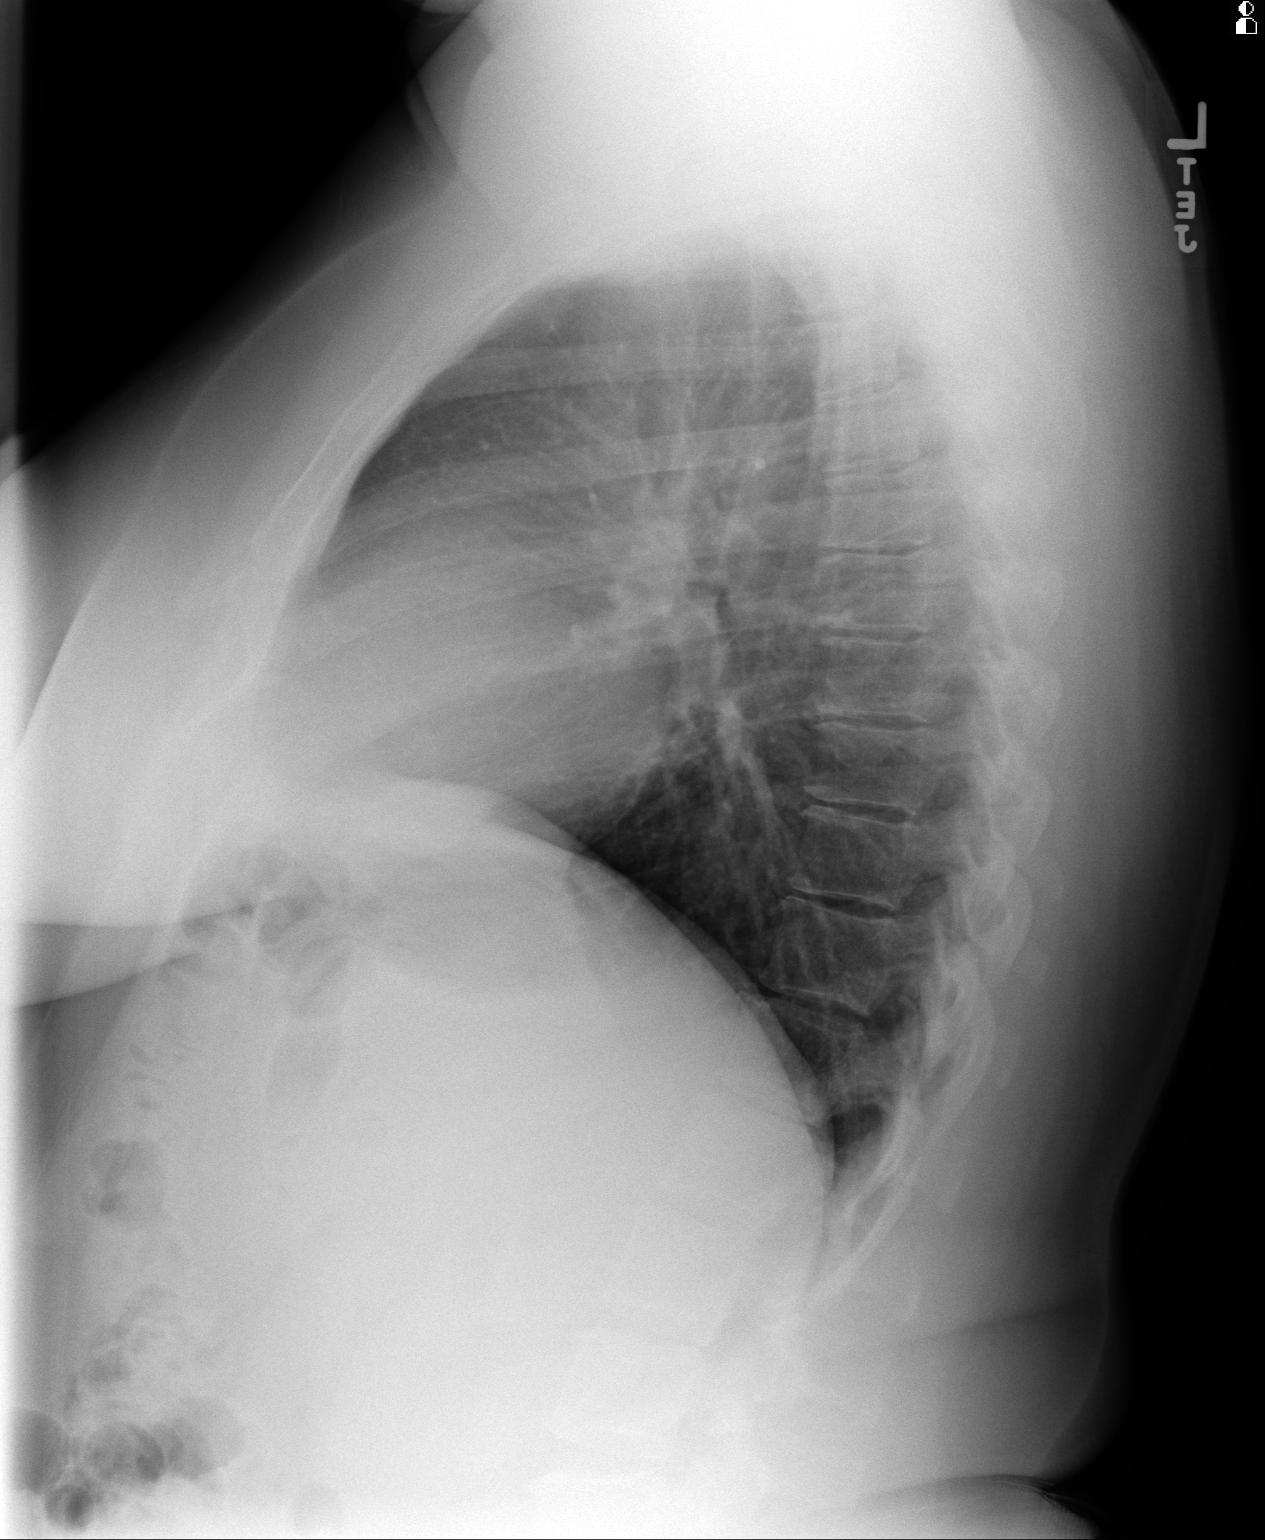

[3 of 3 positions shown; findings below may reference images not displayed]

FINDINGS: Lungs are clear. Heart size and pulmonary vascularity are normal. No
adenopathy. No bone lesions.
IMPRESSION: No edema or consolidation.

## 2014-06-05 ENCOUNTER — Ambulatory Visit: Payer: Self-pay | Admitting: Internal Medicine

## 2014-06-05 LAB — IRON AND TIBC
Iron Bind.Cap.(Total): 320 ug/dL (ref 250–450)
Iron Saturation: 16 %
Iron: 51 ug/dL (ref 50–170)
Unbound Iron-Bind.Cap.: 269 ug/dL

## 2014-06-05 LAB — CANCER CENTER HEMOGLOBIN: HGB: 12.9 g/dL (ref 12.0–16.0)

## 2014-06-05 LAB — FERRITIN: Ferritin (ARMC): 98 ng/mL (ref 8–388)

## 2014-06-15 ENCOUNTER — Ambulatory Visit: Payer: Self-pay

## 2014-06-15 LAB — RAPID STREP-A WITH REFLX: MICRO TEXT REPORT: NEGATIVE

## 2014-06-16 ENCOUNTER — Ambulatory Visit: Payer: Self-pay | Admitting: Internal Medicine

## 2014-06-18 LAB — BETA STREP CULTURE(ARMC)

## 2014-06-22 ENCOUNTER — Ambulatory Visit: Payer: Self-pay

## 2014-09-25 ENCOUNTER — Ambulatory Visit
Admit: 2014-09-25 | Disposition: A | Discharge status: Home / Self Care | Payer: Self-pay | Attending: Internal Medicine | Admitting: Internal Medicine

## 2014-10-16 ENCOUNTER — Ambulatory Visit
Admit: 2014-10-16 | Disposition: A | Discharge status: Home / Self Care | Payer: Self-pay | Attending: Internal Medicine | Admitting: Internal Medicine

## 2014-12-18 ENCOUNTER — Inpatient Hospital Stay: Payer: BLUE CROSS/BLUE SHIELD

## 2014-12-18 ENCOUNTER — Inpatient Hospital Stay: Payer: BLUE CROSS/BLUE SHIELD | Attending: Internal Medicine

## 2014-12-18 ENCOUNTER — Encounter (INDEPENDENT_AMBULATORY_CARE_PROVIDER_SITE_OTHER): Payer: Self-pay

## 2014-12-18 DIAGNOSIS — D509 Iron deficiency anemia, unspecified: Secondary | ICD-10-CM | POA: Insufficient documentation

## 2014-12-18 DIAGNOSIS — Z79899 Other long term (current) drug therapy: Secondary | ICD-10-CM | POA: Insufficient documentation

## 2014-12-18 DIAGNOSIS — D500 Iron deficiency anemia secondary to blood loss (chronic): Secondary | ICD-10-CM | POA: Insufficient documentation

## 2014-12-18 DIAGNOSIS — E538 Deficiency of other specified B group vitamins: Secondary | ICD-10-CM | POA: Diagnosis not present

## 2014-12-18 DIAGNOSIS — D72819 Decreased white blood cell count, unspecified: Secondary | ICD-10-CM | POA: Diagnosis not present

## 2014-12-18 LAB — IRON AND TIBC
IRON: 41 ug/dL (ref 28–170)
Saturation Ratios: 12 % (ref 10.4–31.8)
TIBC: 345 ug/dL (ref 250–450)
UIBC: 304 ug/dL

## 2014-12-18 LAB — FERRITIN: Ferritin: 23 ng/mL (ref 11–307)

## 2014-12-18 LAB — HEMOGLOBIN: HEMOGLOBIN: 11.9 g/dL — AB (ref 12.0–16.0)

## 2014-12-18 MED ORDER — SODIUM CHLORIDE 0.9 % IV SOLN
200.0000 mg | Freq: Once | INTRAVENOUS | Status: AC
  Administered 2014-12-18: 200 mg via INTRAVENOUS
  Filled 2014-12-18: qty 10

## 2014-12-18 MED ORDER — SODIUM CHLORIDE 0.9 % IV SOLN
INTRAVENOUS | Status: DC
  Administered 2014-12-18: 16:00:00 via INTRAVENOUS
  Filled 2014-12-18: qty 1000

## 2015-03-12 ENCOUNTER — Inpatient Hospital Stay: Payer: BLUE CROSS/BLUE SHIELD

## 2015-03-12 ENCOUNTER — Inpatient Hospital Stay: Payer: BLUE CROSS/BLUE SHIELD | Attending: Internal Medicine

## 2015-03-12 DIAGNOSIS — Z79899 Other long term (current) drug therapy: Secondary | ICD-10-CM | POA: Insufficient documentation

## 2015-03-12 DIAGNOSIS — D509 Iron deficiency anemia, unspecified: Secondary | ICD-10-CM

## 2015-03-12 LAB — IRON AND TIBC
IRON: 49 ug/dL (ref 28–170)
SATURATION RATIOS: 14 % (ref 10.4–31.8)
TIBC: 356 ug/dL (ref 250–450)
UIBC: 307 ug/dL

## 2015-03-12 LAB — FERRITIN: FERRITIN: 17 ng/mL (ref 11–307)

## 2015-03-12 LAB — HEMOGLOBIN: Hemoglobin: 11.6 g/dL — ABNORMAL LOW (ref 12.0–16.0)

## 2015-03-12 MED ORDER — SODIUM CHLORIDE 0.9 % IV SOLN
INTRAVENOUS | Status: DC
  Administered 2015-03-12: 15:00:00 via INTRAVENOUS

## 2015-03-12 MED ORDER — SODIUM CHLORIDE 0.9 % IV SOLN
200.0000 mg | Freq: Once | INTRAVENOUS | Status: AC
  Administered 2015-03-12: 200 mg via INTRAVENOUS
  Filled 2015-03-12: qty 10

## 2015-06-04 ENCOUNTER — Inpatient Hospital Stay: Payer: BLUE CROSS/BLUE SHIELD

## 2015-06-04 ENCOUNTER — Inpatient Hospital Stay: Payer: BLUE CROSS/BLUE SHIELD | Attending: Internal Medicine

## 2015-06-04 VITALS — BP 127/80 | HR 73 | Resp 20

## 2015-06-04 DIAGNOSIS — Z79899 Other long term (current) drug therapy: Secondary | ICD-10-CM | POA: Insufficient documentation

## 2015-06-04 DIAGNOSIS — D509 Iron deficiency anemia, unspecified: Secondary | ICD-10-CM

## 2015-06-04 LAB — IRON AND TIBC
Iron: 43 ug/dL (ref 28–170)
Saturation Ratios: 12 % (ref 10.4–31.8)
TIBC: 375 ug/dL (ref 250–450)
UIBC: 333 ug/dL

## 2015-06-04 LAB — HEMOGLOBIN: HEMOGLOBIN: 11.3 g/dL — AB (ref 12.0–16.0)

## 2015-06-04 LAB — FERRITIN: Ferritin: 10 ng/mL — ABNORMAL LOW (ref 11–307)

## 2015-06-04 MED ORDER — SODIUM CHLORIDE 0.9 % IV SOLN
INTRAVENOUS | Status: DC
  Administered 2015-06-04: 15:00:00 via INTRAVENOUS
  Filled 2015-06-04: qty 1000

## 2015-06-04 MED ORDER — IRON SUCROSE 20 MG/ML IV SOLN
200.0000 mg | Freq: Once | INTRAVENOUS | Status: AC
Start: 2015-06-04 — End: 2015-06-04
  Administered 2015-06-04: 200 mg via INTRAVENOUS
  Filled 2015-06-04: qty 10

## 2015-07-29 ENCOUNTER — Ambulatory Visit
Admission: EM | Admit: 2015-07-29 | Discharge: 2015-07-29 | Disposition: A | Discharge status: Home / Self Care | Payer: BLUE CROSS/BLUE SHIELD | Attending: Family Medicine | Admitting: Family Medicine

## 2015-07-29 DIAGNOSIS — J209 Acute bronchitis, unspecified: Secondary | ICD-10-CM | POA: Diagnosis not present

## 2015-07-29 DIAGNOSIS — R059 Cough, unspecified: Secondary | ICD-10-CM

## 2015-07-29 DIAGNOSIS — J019 Acute sinusitis, unspecified: Secondary | ICD-10-CM | POA: Diagnosis not present

## 2015-07-29 DIAGNOSIS — R05 Cough: Secondary | ICD-10-CM | POA: Diagnosis not present

## 2015-07-29 MED ORDER — HYDROCOD POLST-CPM POLST ER 10-8 MG/5ML PO SUER: 5.0000 mL | Freq: Two times a day (BID) | ORAL | Status: DC | PRN

## 2015-07-29 MED ORDER — AZITHROMYCIN 500 MG PO TABS: ORAL_TABLET | ORAL | Status: DC

## 2015-07-29 MED ORDER — FEXOFENADINE-PSEUDOEPHED ER 180-240 MG PO TB24: 1.0000 | ORAL_TABLET | Freq: Every day | ORAL | Status: DC

## 2015-07-29 NOTE — ED Notes (Signed)
Pt c/o cough with sinus and chest congestion for the past 3 weeks, states she has been taking OTC and a cough medication she was Rx last year but is having no relief, states she is having green colored mucous..Marland Kitchen

## 2015-07-29 NOTE — ED Provider Notes (Signed)
CSN: [id number]     Arrival date & time 07/29/15  [id number] History   First MD Initiated Contact with Patient 07/29/15 0945      Nurses notes were reviewed. Chief Complaint  Patient presents with  . URI   Patient reports since Wednesday after Christmas now over 2 weeks she's had cough postnasal drainage. States coughing yellowish-green dull tissue present driving her husband coworkers crazy with chronic cough and persistent cough. She denies any fever states that she's had trouble sleeping at night because the cough as well. She is a history of anemia chronic has had to have iron infusions as well. She does not smoke. No significant family medical history present.    (Consider location/radiation/quality/duration/timing/severity/associated sxs/prior Treatment) Patient is a 50 y.o. female presenting with URI. The history is provided by the patient. No language interpreter was used.  URI Presenting symptoms: cough   Severity:  Moderate Duration:  2 weeks Timing:  Constant Progression:  Worsening Chronicity:  New Relieved by:  Nothing Ineffective treatments:  OTC medications Associated symptoms: no arthralgias, no headaches, no myalgias and no wheezing   Risk factors: no diabetes mellitus, no recent illness and no recent travel     Past Medical History  Diagnosis Date  . Anemia     iron infusion quarterly   Past Surgical History  Procedure Laterality Date  . Tonsillectomy    . Tmj arthroscopy    . Cesarean section     No family history on file. Social History  Substance Use Topics  . Smoking status: Never Smoker   . Smokeless tobacco: None  . Alcohol Use: No   OB History    No data available     Review of Systems  HENT: Positive for sinus pressure.   Respiratory: Positive for cough. Negative for wheezing.   Musculoskeletal: Negative for myalgias and arthralgias.  Neurological: Negative for headaches.  All other systems reviewed and are negative.   Allergies  Review  of patient's allergies indicates no known allergies.  Home Medications   Prior to Admission medications   Medication Sig Start Date End Date Taking? Authorizing Provider  azithromycin (ZITHROMAX) 500 MG tablet 1 tablet daily 5 days 07/29/15   Hassan RowanEugene Amelie Caracci, MD  chlorpheniramine-HYDROcodone [facility](TUSSIONEX PENNKINETIC ER) 10-8 MG/5ML SUER Take 5 mLs by mouth every 12 (twelve) hours as needed for cough. 07/29/15   Hassan RowanEugene Olevia Westervelt, MD  fexofenadine-pseudoephedrine (ALLEGRA-D ALLERGY & CONGESTION) 180-240 MG 24 hr tablet Take 1 tablet by mouth daily. 07/29/15   Hassan RowanEugene Sheala Dosh, MD   Meds Ordered and Administered this Visit  Medications - No data to display  BP 135/58 mmHg  Pulse 66  Temp(Src) 97.8 F (36.6 C) (Oral)  Resp 18  Ht 5\' 1"  (1.549 m)  Wt 210 lb (95.255 kg)  BMI 39.70 kg/m2  SpO2 98%  LMP 07/18/2015 No data found.   Physical Exam  Constitutional: She is oriented to person, place, and time. She appears well-developed and well-nourished.  HENT:  Head: Normocephalic and atraumatic.  Right Ear: External ear normal.  Left Ear: External ear normal.  Eyes: Conjunctivae are normal. Pupils are equal, round, and reactive to light.  Neck: Neck supple. No thyromegaly present.  Cardiovascular: Normal rate, regular rhythm and normal heart sounds.   Pulmonary/Chest: Effort normal and breath sounds normal. No respiratory distress. She has no wheezes.  Musculoskeletal: Normal range of motion.  Neurological: She is alert and oriented to person, place, and time. No cranial nerve deficit.  Skin: Skin is warm.  No erythema.  Psychiatric: She has a normal mood and affect. Her behavior is normal.  Vitals reviewed.   ED Course  Procedures (including critical care time)  Labs Review Labs Reviewed - No data to display  Imaging Review No results found.   Visual Acuity Review  Right Eye Distance:   Left Eye Distance:   Bilateral Distance:    Right Eye Near:   Left Eye Near:    Bilateral Near:          MDM   1. Acute bronchitis, unspecified organism   2. Acute sinusitis, recurrence not specified, unspecified location   3. Cough    For the acute sinusitis and bronchitis we'll place on Tussionex 1 teaspoon twice a day for the cough. Allegra-D. And we'll place on Zithromax 500 mg 1 tablet daily for the next 5 days. She declines work note return to office follow-up when necessary.    Hassan Rowan, MD 07/29/15 [phone]

## 2015-07-29 NOTE — Discharge Instructions (Signed)
Acute Bronchitis Bronchitis is when the airways that extend from the windpipe into the lungs get red, puffy, and painful (inflamed). Bronchitis often causes thick spit (mucus) to develop. This leads to a cough. A cough is the most common symptom of bronchitis. In acute bronchitis, the condition usually begins suddenly and goes away over time (usually in 2 weeks). Smoking, allergies, and asthma can make bronchitis worse. Repeated episodes of bronchitis may cause more lung problems. HOME CARE  Rest.  Drink enough fluids to keep your pee (urine) clear or pale yellow (unless you need to limit fluids as told by your doctor).  Only take over-the-counter or prescription medicines as told by your doctor.  Avoid smoking and secondhand smoke. These can make bronchitis worse. If you are a smoker, think about using nicotine gum or skin patches. Quitting smoking will help your lungs heal faster.  Reduce the chance of getting bronchitis again by:  Washing your hands often.  Avoiding people with cold symptoms.  Trying not to touch your hands to your mouth, nose, or eyes.  Follow up with your doctor as told. GET HELP IF: Your symptoms do not improve after 1 week of treatment. Symptoms include:  Cough.  Fever.  Coughing up thick spit.  Body aches.  Chest congestion.  Chills.  Shortness of breath.  Sore throat. GET HELP RIGHT AWAY IF:   You have an increased fever.  You have chills.  You have severe shortness of breath.  You have bloody thick spit (sputum).  You throw up (vomit) often.  You lose too much body fluid (dehydration).  You have a severe headache.  You faint. MAKE SURE YOU:   Understand these instructions.  Will watch your condition.  Will get help right away if you are not doing well or get worse.   This information is not intended to replace advice given to you by your health care provider. Make sure you discuss any questions you have with your health care  provider.   Document Released: 12/20/2007 Document Revised: 03/05/2013 Document Reviewed: 12/24/2012 Elsevier Interactive Patient Education 2016 Elsevier Inc.  Cough, Adult A cough helps to clear your throat and lungs. A cough may last only 2-3 weeks (acute), or it may last longer than 8 weeks (chronic). Many different things can cause a cough. A cough may be a sign of an illness or another medical condition. HOME CARE  Pay attention to any changes in your cough.  Take medicines only as told by your doctor.  If you were prescribed an antibiotic medicine, take it as told by your doctor. Do not stop taking it even if you start to feel better.  Talk with your doctor before you try using a cough medicine.  Drink enough fluid to keep your pee (urine) clear or pale yellow.  If the air is dry, use a cold steam vaporizer or humidifier in your home.  Stay away from things that make you cough at work or at home.  If your cough is worse at night, try using extra pillows to raise your head up higher while you sleep.  Do not smoke, and try not to be around smoke. If you need help quitting, ask your doctor.  Do not have caffeine.  Do not drink alcohol.  Rest as needed. GET HELP IF:  You have new problems (symptoms).  You cough up yellow fluid (pus).  Your cough does not get better after 2-3 weeks, or your cough gets worse.  Medicine does not  help your cough and you are not sleeping well.  You have pain that gets worse or pain that is not helped with medicine.  You have a fever.  You are losing weight and you do not know why.  You have night sweats. GET HELP RIGHT AWAY IF:  You cough up blood.  You have trouble breathing.  Your heartbeat is very fast.   This information is not intended to replace advice given to you by your health care provider. Make sure you discuss any questions you have with your health care provider.   Document Released: 03/16/2011 Document Revised:  03/24/2015 Document Reviewed: 09/09/2014 Elsevier Interactive Patient Education 2016 Elsevier Inc.  Sinusitis, Adult Sinusitis is redness, soreness, and puffiness (inflammation) of the air pockets in the bones of your face (sinuses). The redness, soreness, and puffiness can cause air and mucus to get trapped in your sinuses. This can allow germs to grow and cause an infection.  HOME CARE   Drink enough fluids to keep your pee (urine) clear or pale yellow.  Use a humidifier in your home.  Run a hot shower to create steam in the bathroom. Sit in the bathroom with the door closed. Breathe in the steam 3-4 times a day.  Put a warm, moist washcloth on your face 3-4 times a day, or as told by your doctor.  Use salt water sprays (saline sprays) to wet the thick fluid in your nose. This can help the sinuses drain.  Only take medicine as told by your doctor. GET HELP RIGHT AWAY IF:   Your pain gets worse.  You have very bad headaches.  You are sick to your stomach (nauseous).  You throw up (vomit).  You are very sleepy (drowsy) all the time.  Your face is puffy (swollen).  Your vision changes.  You have a stiff neck.  You have trouble breathing. MAKE SURE YOU:   Understand these instructions.  Will watch your condition.  Will get help right away if you are not doing well or get worse.   This information is not intended to replace advice given to you by your health care provider. Make sure you discuss any questions you have with your health care provider.   Document Released: 12/20/2007 Document Revised: 07/24/2014 Document Reviewed: 02/06/2012 Elsevier Interactive Patient Education [employer]2016 Elsevier Inc.

## 2015-08-24 ENCOUNTER — Other Ambulatory Visit: Payer: Self-pay | Admitting: *Deleted

## 2015-08-24 ENCOUNTER — Encounter: Payer: Self-pay | Admitting: *Deleted

## 2015-08-24 DIAGNOSIS — D509 Iron deficiency anemia, unspecified: Secondary | ICD-10-CM

## 2015-08-26 ENCOUNTER — Other Ambulatory Visit: Payer: BLUE CROSS/BLUE SHIELD

## 2015-08-27 ENCOUNTER — Inpatient Hospital Stay: Payer: BLUE CROSS/BLUE SHIELD

## 2015-08-27 ENCOUNTER — Inpatient Hospital Stay: Payer: BLUE CROSS/BLUE SHIELD | Attending: Internal Medicine | Admitting: Internal Medicine

## 2015-08-27 VITALS — BP 132/84 | HR 71 | Resp 18

## 2015-08-27 VITALS — BP 162/87 | HR 87 | Temp 95.5°F | Resp 18 | Ht 61.0 in | Wt 220.0 lb

## 2015-08-27 DIAGNOSIS — N920 Excessive and frequent menstruation with regular cycle: Secondary | ICD-10-CM | POA: Insufficient documentation

## 2015-08-27 DIAGNOSIS — D509 Iron deficiency anemia, unspecified: Secondary | ICD-10-CM | POA: Insufficient documentation

## 2015-08-27 DIAGNOSIS — Z79899 Other long term (current) drug therapy: Secondary | ICD-10-CM | POA: Insufficient documentation

## 2015-08-27 DIAGNOSIS — E282 Polycystic ovarian syndrome: Secondary | ICD-10-CM | POA: Insufficient documentation

## 2015-08-27 DIAGNOSIS — K449 Diaphragmatic hernia without obstruction or gangrene: Secondary | ICD-10-CM | POA: Insufficient documentation

## 2015-08-27 LAB — CBC WITH DIFFERENTIAL/PLATELET
Basophils Absolute: 0 10*3/uL (ref 0–0.1)
Basophils Relative: 1 %
EOS ABS: 0.1 10*3/uL (ref 0–0.7)
EOS PCT: 2 %
HCT: 31.2 % — ABNORMAL LOW (ref 35.0–47.0)
Hemoglobin: 10.8 g/dL — ABNORMAL LOW (ref 12.0–16.0)
LYMPHS ABS: 1.5 10*3/uL (ref 1.0–3.6)
LYMPHS PCT: 33 %
MCH: 26.3 pg (ref 26.0–34.0)
MCHC: 34.5 g/dL (ref 32.0–36.0)
MCV: 76 fL — AB (ref 80.0–100.0)
MONOS PCT: 7 %
Monocytes Absolute: 0.3 10*3/uL (ref 0.2–0.9)
Neutro Abs: 2.6 10*3/uL (ref 1.4–6.5)
Neutrophils Relative %: 57 %
PLATELETS: 253 10*3/uL (ref 150–440)
RBC: 4.1 MIL/uL (ref 3.80–5.20)
RDW: 14.5 % (ref 11.5–14.5)
WBC: 4.6 10*3/uL (ref 3.6–11.0)

## 2015-08-27 LAB — FERRITIN: FERRITIN: 6 ng/mL — AB (ref 11–307)

## 2015-08-27 LAB — IRON AND TIBC
IRON: 29 ug/dL (ref 28–170)
Saturation Ratios: 7 % — ABNORMAL LOW (ref 10.4–31.8)
TIBC: 416 ug/dL (ref 250–450)
UIBC: 387 ug/dL

## 2015-08-27 MED ORDER — SODIUM CHLORIDE 0.9 % IV SOLN
510.0000 mg | Freq: Once | INTRAVENOUS | Status: AC
  Administered 2015-08-27: 510 mg via INTRAVENOUS
  Filled 2015-08-27: qty 17

## 2015-08-27 MED ORDER — SODIUM CHLORIDE 0.9 % IV SOLN
Freq: Once | INTRAVENOUS | Status: AC
  Administered 2015-08-27: 15:00:00 via INTRAVENOUS
  Filled 2015-08-27: qty 1000

## 2015-08-27 NOTE — Progress Notes (Signed)
[facility] OFFICE PROGRESS NOTE  Patient Care Team: No Pcp Per Patient as PCP - General (General Practice)   SUMMARY OF ONCOLOGIC HISTORY:  # 2010-IRON DEFICIENCY ANEMIA; 2012- EGD/colo-NEG [Dr.Elliot] s/p IV Ferrahem [last 6295]; on Maint Venofer 200; Feb 2017-ferrhem q3M  # Mild Leucopenia [2009-BMBx- normo-cellular-flow/cyto-neg]/ B12 def- on B12 shots  INTERVAL HISTORY:  This is my first interaction with the patient since I joined the practice September 2016. I reviewed the patient's prior charts/pertinent labs in detail; findings are summarized above.   50 year old female patient with long-standing history of iron deficiency anemia-not responsive to by mouth iron is here for follow-up.  Patient complains of fatigue. Shortness of breath with exertion.    Patient continues to have heavy menstrual periods.  Denies any blood in stools black stools. Denies any weight loss.  REVIEW OF SYSTEMS:  A complete 10 point review of system is done which is negative except mentioned above/history of present illness.   PAST MEDICAL HISTORY :  Past Medical History  Diagnosis Date  . IDA (iron deficiency anemia)     iron infusion quarterly  . Leukopenia   . Vitamin B 12 deficiency   . Polycystic ovarian disease   . Hiatal hernia     PAST SURGICAL HISTORY :   Past Surgical History  Procedure Laterality Date  . Tonsillectomy    . Tmj arthroscopy  1995  . Cesarean section  2003, 2007    x 2  . Bone marrow biopsy  01/29/2008  . Colonoscopy  2009  . Esophagogastroduodenoscopy  2009    FAMILY HISTORY :   Family History  Problem Relation Age of Onset  . CAD    . Ulcers    . Colon polyps Father   . Colon polyps Mother     SOCIAL HISTORY:   Social History  Substance Use Topics  . Smoking status: Never Smoker   . Smokeless tobacco: Never Used  . Alcohol Use: No    ALLERGIES:  is allergic to sulfa antibiotics.  MEDICATIONS:  Current Outpatient Prescriptions   Medication Sig Dispense Refill  . metoCLOPramide (REGLAN) 10 MG tablet Take by mouth.    Marland Kitchen omeprazole (PRILOSEC) 40 MG capsule Take by mouth.     No current facility-administered medications for this visit.    PHYSICAL EXAMINATION:  BP 162/87 mmHg  Pulse 87  Temp(Src) 95.5 F (35.3 C)  Resp 18  Ht 5' 1"  (1.549 m)  Wt 220 lb 0.3 oz (99.8 kg)  BMI 41.59 kg/m2  LMP 07/18/2015  Filed Weights   08/27/15 1416  Weight: 220 lb 0.3 oz (99.8 kg)    GENERAL: Well-nourished well-developed; Alert, no distress and comfortable. She is alone. EYES: no pallor or icterus OROPHARYNX: no thrush or ulceration; good dentition  NECK: supple, no masses felt LYMPH:  no palpable lymphadenopathy in the cervical, axillary or inguinal regions LUNGS: clear to auscultation and  No wheeze or crackles HEART/CVS: regular rate & rhythm and no murmurs; No lower extremity edema ABDOMEN:abdomen soft, non-tender and normal bowel sounds Musculoskeletal:no cyanosis of digits and no clubbing  PSYCH: alert & oriented x 3 with fluent speech NEURO: no focal motor/sensory deficits SKIN:  no rashes or significant lesions   No results found for: SPEP, UPEP  Lab Results  Component Value Date   WBC 4.6 08/27/2015   NEUTROABS 2.6 08/27/2015   HGB 10.8* 08/27/2015   HCT 31.2* 08/27/2015   MCV 76.0* 08/27/2015   PLT 253 08/27/2015  Chemistry   No results found for: NA, K, CL, CO2, BUN, CREATININE, GLU No results found for: CALCIUM, ALKPHOS, AST, ALT, BILITOT    ASSESSMENT & PLAN:   # Iron deficiency Anemia- unclear etiology/question malabsorption/ menorrhagia. Today hemoglobin is 10.3/MCV 77 ferritin pending. Patient likely is iron deficient. We will need IV iron.  I would recommend transitioning to Feraheme  510 mg every 3 months.   #  Menorrhagia-  Patient is  Reluctant to see a gynecologist. She thinks she  Is going through menopause.   #  Recommend CBC and studies in 3 months/Possible IV Feraheme;   Follow-up with me in 6 months with labs/ possible ferriheme.  Patient will have  Labs done few days as prior.  # 15 minutes face-to-face with the patient discussing the above plan of care; more than 50% of time spent on natural history; counseling and coordination.     Cammie Sickle, MD 08/27/2015 2:29 PM

## 2015-10-12 ENCOUNTER — Ambulatory Visit (INDEPENDENT_AMBULATORY_CARE_PROVIDER_SITE_OTHER)
Admit: 2015-10-12 | Discharge: 2015-10-12 | Disposition: A | Discharge status: Home / Self Care | Payer: BLUE CROSS/BLUE SHIELD | Attending: Family Medicine | Admitting: Family Medicine

## 2015-10-12 ENCOUNTER — Ambulatory Visit
Admission: EM | Admit: 2015-10-12 | Discharge: 2015-10-12 | Disposition: A | Discharge status: Home / Self Care | Payer: BLUE CROSS/BLUE SHIELD | Attending: Family Medicine | Admitting: Family Medicine

## 2015-10-12 ENCOUNTER — Encounter: Payer: Self-pay | Admitting: *Deleted

## 2015-10-12 DIAGNOSIS — R1012 Left upper quadrant pain: Secondary | ICD-10-CM | POA: Diagnosis not present

## 2015-10-12 DIAGNOSIS — N859 Noninflammatory disorder of uterus, unspecified: Secondary | ICD-10-CM

## 2015-10-12 DIAGNOSIS — R109 Unspecified abdominal pain: Secondary | ICD-10-CM

## 2015-10-12 DIAGNOSIS — N949 Unspecified condition associated with female genital organs and menstrual cycle: Secondary | ICD-10-CM | POA: Diagnosis not present

## 2015-10-12 LAB — COMPREHENSIVE METABOLIC PANEL
ALK PHOS: 60 U/L (ref 38–126)
ALT: 18 U/L (ref 14–54)
AST: 18 U/L (ref 15–41)
Albumin: 4.3 g/dL (ref 3.5–5.0)
Anion gap: 3 — ABNORMAL LOW (ref 5–15)
BUN: 8 mg/dL (ref 6–20)
CALCIUM: 8.2 mg/dL — AB (ref 8.9–10.3)
CO2: 27 mmol/L (ref 22–32)
CREATININE: 0.81 mg/dL (ref 0.44–1.00)
Chloride: 102 mmol/L (ref 101–111)
Glucose, Bld: 107 mg/dL — ABNORMAL HIGH (ref 65–99)
Potassium: 3.5 mmol/L (ref 3.5–5.1)
Sodium: 132 mmol/L — ABNORMAL LOW (ref 135–145)
TOTAL PROTEIN: 7 g/dL (ref 6.5–8.1)
Total Bilirubin: 0.4 mg/dL (ref 0.3–1.2)

## 2015-10-12 LAB — CBC WITH DIFFERENTIAL/PLATELET
BASOS PCT: 0 %
Basophils Absolute: 0 10*3/uL (ref 0–0.1)
EOS ABS: 0 10*3/uL (ref 0–0.7)
Eosinophils Relative: 0 %
HCT: 32.1 % — ABNORMAL LOW (ref 35.0–47.0)
HEMOGLOBIN: 11.1 g/dL — AB (ref 12.0–16.0)
Lymphocytes Relative: 12 %
Lymphs Abs: 0.7 10*3/uL — ABNORMAL LOW (ref 1.0–3.6)
MCH: 27.5 pg (ref 26.0–34.0)
MCHC: 34.5 g/dL (ref 32.0–36.0)
MCV: 79.7 fL — ABNORMAL LOW (ref 80.0–100.0)
Monocytes Absolute: 0.2 10*3/uL (ref 0.2–0.9)
Monocytes Relative: 4 %
NEUTROS PCT: 84 %
Neutro Abs: 5.1 10*3/uL (ref 1.4–6.5)
Platelets: 216 10*3/uL (ref 150–440)
RBC: 4.03 MIL/uL (ref 3.80–5.20)
RDW: 16.8 % — ABNORMAL HIGH (ref 11.5–14.5)
WBC: 6.1 10*3/uL (ref 3.6–11.0)

## 2015-10-12 LAB — URINALYSIS COMPLETE WITH MICROSCOPIC (ARMC ONLY)
BILIRUBIN URINE: NEGATIVE
Bacteria, UA: NONE SEEN
GLUCOSE, UA: NEGATIVE mg/dL
Ketones, ur: NEGATIVE mg/dL
Leukocytes, UA: NEGATIVE
NITRITE: NEGATIVE
SPECIFIC GRAVITY, URINE: 1.03 (ref 1.005–1.030)
pH: 5.5 (ref 5.0–8.0)

## 2015-10-12 LAB — PREGNANCY, URINE: Preg Test, Ur: NEGATIVE

## 2015-10-12 LAB — AMYLASE: Amylase: 46 U/L (ref 28–100)

## 2015-10-12 LAB — LIPASE, BLOOD: Lipase: 21 U/L (ref 11–51)

## 2015-10-12 MED ORDER — KETOROLAC TROMETHAMINE 60 MG/2ML IM SOLN
60.0000 mg | Freq: Once | INTRAMUSCULAR | Status: AC
  Administered 2015-10-12: 60 mg via INTRAMUSCULAR

## 2015-10-12 NOTE — ED Notes (Signed)
Left flank pain since noon today. Radiates to left groin. Denies injury.

## 2015-10-12 NOTE — ED Provider Notes (Signed)
CSN: [id number]     Arrival date & time 10/12/15  1319 History   First MD Initiated Contact with Patient 10/12/15 1539    Nurses notes were reviewed. Chief Complaint  Patient presents with  . Flank Pain    Patient is here because of left flank pain that started this morning. She's had ovarian cysts with ruptured but states the pain is very different from that. She states is ago (about rupture she'll have a sharp pain at the left about 23 minutes pain gets better she'll have discomfort for day but knows is going to get better. With this a sharp pain is is hit or if moved from the left flank down into her groin area and has been progressively getting worse. She denies any nausea but had to get a shot of Toradol which she got here which is helped tremendously for the pain.  She has history of iron deficiency anemia and leukopenia vitamin B12 deficiency a possible very disease without a hernia. She's had a C-section and tonsillectomy and colonoscopy and bone marrow biopsy. Her father and mother both have had colonic polyps and this history coronary artery disease as well as ulcers in the family. She never smoked and is currently on forcing her menstrual cycle which started Sunday. She is allergic to sulfa drugs.  (Consider location/radiation/quality/duration/timing/severity/associated sxs/prior Treatment) Patient is a 50 y.o. female presenting with flank pain. The history is provided by the patient. No language interpreter was used.  Flank Pain This is a new problem. The current episode started 6 to 12 hours ago. The problem occurs constantly. The problem has been rapidly worsening. Associated symptoms include abdominal pain. Pertinent negatives include no chest pain, no headaches and no shortness of breath. Nothing aggravates the symptoms. Nothing relieves the symptoms. The treatment provided no relief.    Past Medical History  Diagnosis Date  . IDA (iron deficiency anemia)     iron infusion  quarterly  . Leukopenia   . Vitamin B 12 deficiency   . Polycystic ovarian disease   . Hiatal hernia    Past Surgical History  Procedure Laterality Date  . Tonsillectomy    . Tmj arthroscopy  1995  . Cesarean section  2003, 2007    x 2  . Bone marrow biopsy  01/29/2008  . Colonoscopy  2009  . Esophagogastroduodenoscopy  2009   Family History  Problem Relation Age of Onset  . CAD    . Ulcers    . Colon polyps Father   . Colon polyps Mother    Social History  Substance Use Topics  . Smoking status: Never Smoker   . Smokeless tobacco: Never Used  . Alcohol Use: No   OB History    No data available     Review of Systems  Respiratory: Negative for shortness of breath.   Cardiovascular: Negative for chest pain.  Gastrointestinal: Positive for abdominal pain.  Genitourinary: Positive for flank pain.  Neurological: Negative for headaches.  All other systems reviewed and are negative.   Allergies  Sulfa antibiotics  Home Medications   Prior to Admission medications   Medication Sig Start Date End Date Taking? Authorizing Provider  omeprazole (PRILOSEC) 40 MG capsule Take by mouth.   Yes Historical Provider, MD  metoCLOPramide (REGLAN) 10 MG tablet Take by mouth.    Historical Provider, MD   Meds Ordered and Administered this Visit   Medications  ketorolac (TORADOL) injection 60 mg (60 mg Intramuscular Given 10/12/15 1501)  BP 138/65 mmHg  Pulse 70  Temp(Src) 98 F (36.7 C) (Oral)  Resp 16  Ht _0  (1.575 m)  Wt 220 lb (99.791 kg)  BMI 40.23 kg/m2  SpO2 99%  LMP 10/12/2015 (Exact Date) No data found.   Physical Exam  Constitutional: She is oriented to person, place, and time. She appears well-developed.  HENT:  Head: Normocephalic and atraumatic.  Eyes: Conjunctivae are normal. Pupils are equal, round, and reactive to light.  Neck: Normal range of motion. Neck supple. No thyromegaly present.  Cardiovascular: Normal rate, regular rhythm and normal  heart sounds.   Pulmonary/Chest: Effort normal and breath sounds normal.  Abdominal: Soft. She exhibits no distension. There is no tenderness.  Musculoskeletal: Normal range of motion. She exhibits no edema or tenderness.  Neurological: She is alert and oriented to person, place, and time.  Psychiatric: She has a normal mood and affect.  Vitals reviewed.   ED Course  Procedures (including critical care time)  Labs Review Labs Reviewed  URINALYSIS COMPLETEWITH MICROSCOPIC ([place]) - Abnormal; Notable for the following:    Hgb urine dipstick 3+ (*)    Protein, ur TRACE (*)    Squamous Epithelial / LPF 6-30 (*)    All other components within normal limits  COMPREHENSIVE METABOLIC PANEL - Abnormal; Notable for the following:    Sodium 132 (*)    Glucose, Bld 107 (*)    Calcium 8.2 (*)    Anion gap 3 (*)    All other components within normal limits  CBC WITH DIFFERENTIAL/PLATELET - Abnormal; Notable for the following:    Hemoglobin 11.1 (*)    HCT 32.1 (*)    MCV 79.7 (*)    RDW 16.8 (*)    Lymphs Abs 0.7 (*)    All other components within normal limits  URINE CULTURE  AMYLASE  LIPASE, BLOOD  PREGNANCY, URINE    Imaging Review Ct Renal Stone Study  10/12/2015  CLINICAL DATA:  Left flank pain since noon today radiating to left groin EXAM: CT ABDOMEN AND PELVIS WITHOUT CONTRAST TECHNIQUE: Multidetector CT imaging of the abdomen and pelvis was performed following the standard protocol without IV contrast. COMPARISON:  None. FINDINGS: Lower chest:  Visualized portions of the lung bases are clear Hepatobiliary: There is diffuse hepatic steatosis. Gallbladder appears normal. Pancreas: Pancreas is normal Spleen: Spleen is normal Adrenals/Urinary Tract: Adrenal glands are normal. Kidneys are normal. No nephroureterolithiasis or hydroureteronephrosis. Bladder is normal. Stomach/Bowel: Nonobstructive gas pattern. Bowel appears normal including appendix. Vascular/Lymphatic: No significant  vascular abnormalities. There are a few small nonpathologic retroperitoneal lymph nodes. Reproductive: The endometrium appears quite prominent compared to prior study measuring up to about 2 cm with an almost masslike contour in the lower uterine segment. No evidence of pelvic masses. Other: No free fluid is identified. Musculoskeletal: There are no acute musculoskeletal findings IMPRESSION: No nephroureterolithiasis or hydroureteronephrosis. Endometrial prominence as described above with suggestion of masslike contours involving the lower uterine segment. Would suggest further evaluation with pelvic ultrasound. Electronically Signed   By: Skipper Cliche M.D.   On: 10/12/2015 16:35     Visual Acuity Review  Right Eye Distance:   Left Eye Distance:   Bilateral Distance:    Right Eye Near:   Left Eye Near:    Bilateral Near:      Results for orders placed or performed during the hospital encounter of 10/12/15  Urinalysis complete, with microscopic  Result Value Ref Range   Color, Urine YELLOW YELLOW  APPearance CLEAR CLEAR   Glucose, UA NEGATIVE NEGATIVE mg/dL   Bilirubin Urine NEGATIVE NEGATIVE   Ketones, ur NEGATIVE NEGATIVE mg/dL   Specific Gravity, Urine 1.030 1.005 - 1.030   Hgb urine dipstick 3+ (A) NEGATIVE   pH 5.5 5.0 - 8.0   Protein, ur TRACE (A) NEGATIVE mg/dL   Nitrite NEGATIVE NEGATIVE   Leukocytes, UA NEGATIVE NEGATIVE   RBC / HPF TOO NUMEROUS TO COUNT 0 - 5 RBC/hpf   WBC, UA 0-5 0 - 5 WBC/hpf   Bacteria, UA NONE SEEN NONE SEEN   Squamous Epithelial / LPF 6-30 (A) NONE SEEN  Amylase  Result Value Ref Range   Amylase 46 28 - 100 U/L  Lipase, blood  Result Value Ref Range   Lipase 21 11 - 51 U/L  Pregnancy, urine  Result Value Ref Range   Preg Test, Ur NEGATIVE NEGATIVE  Comprehensive metabolic panel  Result Value Ref Range   Sodium 132 (L) 135 - 145 mmol/L   Potassium 3.5 3.5 - 5.1 mmol/L   Chloride 102 101 - 111 mmol/L   CO2 27 22 - 32 mmol/L   Glucose,  Bld 107 (H) 65 - 99 mg/dL   BUN 8 6 - 20 mg/dL   Creatinine, Ser 0.81 0.44 - 1.00 mg/dL   Calcium 8.2 (L) 8.9 - 10.3 mg/dL   Total Protein 7.0 6.5 - 8.1 g/dL   Albumin 4.3 3.5 - 5.0 g/dL   AST 18 15 - 41 U/L   ALT 18 14 - 54 U/L   Alkaline Phosphatase 60 38 - 126 U/L   Total Bilirubin 0.4 0.3 - 1.2 mg/dL   GFR calc non Af Amer >60 >60 mL/min   GFR calc Af Amer >60 >60 mL/min   Anion gap 3 (L) 5 - 15  CBC with Differential  Result Value Ref Range   WBC 6.1 3.6 - 11.0 K/uL   RBC 4.03 3.80 - 5.20 MIL/uL   Hemoglobin 11.1 (L) 12.0 - 16.0 g/dL   HCT 32.1 (L) 35.0 - 47.0 %   MCV 79.7 (L) 80.0 - 100.0 fL   MCH 27.5 26.0 - 34.0 pg   MCHC 34.5 32.0 - 36.0 g/dL   RDW 16.8 (H) 11.5 - 14.5 %   Platelets 216 150 - 440 K/uL   Neutrophils Relative % 84 %   Neutro Abs 5.1 1.4 - 6.5 K/uL   Lymphocytes Relative 12 %   Lymphs Abs 0.7 (L) 1.0 - 3.6 K/uL   Monocytes Relative 4 %   Monocytes Absolute 0.2 0.2 - 0.9 K/uL   Eosinophils Relative 0 %   Eosinophils Absolute 0.0 0 - 0.7 K/uL   Basophils Relative 0 %   Basophils Absolute 0.0 0 - 0.1 K/uL     MDM   1. Acute left flank pain   2. Abdominal pain in female   3. Disorder of endometrium   4. Hypocalcemia    She is doing much better after receiving a shot of Toradol. Nostril with pacemaker pass a stone but no stone was seen on the CT scan. She has history of anemia which is being followed by the hematologist. The ports concern is that she is hypocalcemic and water that she should take taking some calcium supplements and other concern was that she had endometrial thickening which could be anything from December buildup of endometrial tissue to some other more serious abnormality. She does not have a GYN strong suggested she see a PCP and get referred  to GYN of her choice so that this can be evaluated. Also recommend that she get an ultrasound of the pelvis sometime in April if she cannot get that done with her PCP I recommend she come back here  and see me as we get that set up.  We'll give her a work note for today and tomorrow as well.  Note: This dictation was prepared with Dragon dictation along with smaller phrase technology. Any transcriptional errors that result from this process are unintentional.    Frederich Cha, MD 10/12/15 [phone]

## 2015-10-12 NOTE — Discharge Instructions (Signed)
Abdominal Pain, Adult Many things can cause belly (abdominal) pain. Most times, the belly pain is not dangerous. Many cases of belly pain can be watched and treated at home. HOME CARE   Do not take medicines that help you go poop (laxatives) unless told to by your doctor.  Only take medicine as told by your doctor.  Eat or drink as told by your doctor. Your doctor will tell you if you should be on a special diet. GET HELP IF:  You do not know what is causing your belly pain.  You have belly pain while you are sick to your stomach (nauseous) or have runny poop (diarrhea).  You have pain while you pee or poop.  Your belly pain wakes you up at night.  You have belly pain that gets worse or better when you eat.  You have belly pain that gets worse when you eat fatty foods.  You have a fever. GET HELP RIGHT AWAY IF:   The pain does not go away within 2 hours.  You keep throwing up (vomiting).  The pain changes and is only in the right or left part of the belly.  You have bloody or tarry looking poop. MAKE SURE YOU:   Understand these instructions.  Will watch your condition.  Will get help right away if you are not doing well or get worse.   This information is not intended to replace advice given to you by your health care provider. Make sure you discuss any questions you have with your health care provider.   Document Released: 12/20/2007 Document Revised: 07/24/2014 Document Reviewed: 03/12/2013 Elsevier Interactive Patient Education 2016 Elsevier Inc. Hypocalcemia, Adult Hypocalcemia is low blood calcium. Calcium is important for cells to function in the body. Low blood calcium can cause a variety of symptoms and problems. CAUSES  Low levels of a body protein called albumin. Problems with the parathyroid glands or surgical removal of the parathyroid glands. The parathyroid glands maintain the body's level of calcium. Decreased production or improper use of  parathyroid hormone. Lack (deficiency) of vitamin D or magnesium or both. Intestinal problems that interfere with nutrient absorption. Alcoholism. Kidney problems. Inflammation of the pancreas (pancreatitis). Certain medicines. Severe infections (sepsis). Infiltrative diseases. With these diseases the parathyroid glands are filled with cells or substances that are not normally present. Examples include: Sarcoidosis. Hemachromatosis. Breakdown of large amounts of muscle fiber. High levels of phosphate in the body. Cancer. Massive blood transfusions which usually occur with severe trauma. SYMPTOMS  Numbness and tingling in the fingers, toes, or around the mouth. Muscle aches or cramps, especially in the legs, feet, and back. Muscle twitches. Shortness of breath or wheezing. Difficulty swallowing. Changes in the sound of the voice. General weakness. Fainting. Fast heart beats (palpitations). Chest pain. Irritability. Difficulty thinking. Memory problems or confusion. Severe fatigue. Changes in personality. Depression and anxiety. Shaking uncontrollably (seizures). Coarse, brittle hair and nails. Dry skin or lasting (chronic) skin diseases (psoriasis, eczema, or dermatitis). Clouding of the eye lens (cataracts). Abdominal cramping or pain. DIAGNOSIS  Hypocalcemia is usually diagnosed through blood tests that reveal a low level of blood calcium. Other tests, such as a recording of the electrical activity of the heart (electrocardiogram, EKG), may be performed in order to diagnose the underlying cause of the condition. TREATMENT  Treatment for hypocalcemia includes giving calcium supplements. These can be given by mouth or by intravenous (IV) access tube, depending on the severity of the symptoms and deficiency. Other minerals (electrolytes),  such as magnesium, may also be given. HOME CARE INSTRUCTIONS  Meet with a dietitian to make sure you are eating the most healthful diet  possible, or follow diet instructions as directed by your caregiver. Follow up with your caregiver as directed. SEEK IMMEDIATE MEDICAL CARE IF:  You develop chest pain. You develop persistent rapid or irregular heartbeats. You have difficulty breathing. You faint. You develop increased fatigue. You have new swelling in the feet, ankles, or legs. You develop increased muscle twitching. You start to have seizures. You develop confusion. You develop mood, memory, or personality changes. MAKE SURE YOU:  Understand these instructions. Will watch your condition. Will get help right away if you are not doing well or get worse.   This information is not intended to replace advice given to you by your health care provider. Make sure you discuss any questions you have with your health care provider.   Document Released: 12/21/2009 Document Revised: 09/25/2011 Document Reviewed: 11/18/2014 Elsevier Interactive Patient Education [employer]

## 2015-10-12 NOTE — ED Notes (Signed)
Prior authorization completed, [facility] [id number] is good from 10/12/2015 to 11/10/2015. ICD Code N20.0 Abd and pelvis with/out contrast

## 2015-10-14 LAB — URINE CULTURE: Special Requests: NORMAL

## 2015-11-25 ENCOUNTER — Telehealth: Payer: Self-pay | Admitting: *Deleted

## 2015-11-25 ENCOUNTER — Other Ambulatory Visit: Payer: Self-pay | Admitting: Internal Medicine

## 2015-11-25 ENCOUNTER — Inpatient Hospital Stay: Payer: BLUE CROSS/BLUE SHIELD | Attending: Internal Medicine

## 2015-11-25 DIAGNOSIS — Z79899 Other long term (current) drug therapy: Secondary | ICD-10-CM | POA: Insufficient documentation

## 2015-11-25 DIAGNOSIS — N920 Excessive and frequent menstruation with regular cycle: Secondary | ICD-10-CM | POA: Diagnosis not present

## 2015-11-25 DIAGNOSIS — D509 Iron deficiency anemia, unspecified: Secondary | ICD-10-CM | POA: Diagnosis present

## 2015-11-25 LAB — CBC WITH DIFFERENTIAL/PLATELET
Basophils Absolute: 0 10*3/uL (ref 0–0.1)
Basophils Relative: 1 %
Eosinophils Absolute: 0.1 10*3/uL (ref 0–0.7)
Eosinophils Relative: 2 %
HCT: 34.1 % — ABNORMAL LOW (ref 35.0–47.0)
HEMOGLOBIN: 12 g/dL (ref 12.0–16.0)
LYMPHS ABS: 1.8 10*3/uL (ref 1.0–3.6)
LYMPHS PCT: 31 %
MCH: 27.4 pg (ref 26.0–34.0)
MCHC: 35.3 g/dL (ref 32.0–36.0)
MCV: 77.6 fL — AB (ref 80.0–100.0)
MONOS PCT: 8 %
Monocytes Absolute: 0.5 10*3/uL (ref 0.2–0.9)
NEUTROS PCT: 58 %
Neutro Abs: 3.6 10*3/uL (ref 1.4–6.5)
Platelets: 257 10*3/uL (ref 150–440)
RBC: 4.39 MIL/uL (ref 3.80–5.20)
RDW: 14.9 % — ABNORMAL HIGH (ref 11.5–14.5)
WBC: 6 10*3/uL (ref 3.6–11.0)

## 2015-11-25 LAB — FERRITIN: Ferritin: 10 ng/mL — ABNORMAL LOW (ref 11–307)

## 2015-11-25 NOTE — Telephone Encounter (Signed)
Patient states that she has an IV fereheme apt tomorrow.  She will need to keep this appointment.  We will repeat these labs again in 4 months/ and IV iron and MD. The patient already has an apt on February 22, 2016. She will keep these apts.

## 2015-11-25 NOTE — Telephone Encounter (Signed)
-----   Message from Earna CoderGovinda R Brahmanday, MD sent at 11/25/2015  4:15 PM EDT ----- Patient needs IV iron Feraheme next week; also need iron studies CBC in 4 months/follow-up with me/IV Feraheme. Thx

## 2015-11-26 ENCOUNTER — Inpatient Hospital Stay: Payer: BLUE CROSS/BLUE SHIELD

## 2015-11-26 VITALS — BP 133/83 | HR 71 | Temp 98.7°F | Resp 18

## 2015-11-26 DIAGNOSIS — D509 Iron deficiency anemia, unspecified: Secondary | ICD-10-CM

## 2015-11-26 MED ORDER — SODIUM CHLORIDE 0.9 % IV SOLN
510.0000 mg | Freq: Once | INTRAVENOUS | Status: AC
  Administered 2015-11-26: 510 mg via INTRAVENOUS
  Filled 2015-11-26: qty 17

## 2015-11-26 MED ORDER — SODIUM CHLORIDE 0.9 % IV SOLN
Freq: Once | INTRAVENOUS | Status: AC
  Administered 2015-11-26: 14:00:00 via INTRAVENOUS
  Filled 2015-11-26: qty 1000

## 2015-11-29 ENCOUNTER — Encounter
Admission: RE | Admit: 2015-11-29 | Discharge: 2015-11-29 | Disposition: A | Discharge status: Home / Self Care | Payer: BLUE CROSS/BLUE SHIELD | Source: Ambulatory Visit | Attending: Obstetrics & Gynecology | Admitting: Obstetrics & Gynecology

## 2015-11-29 DIAGNOSIS — Z01812 Encounter for preprocedural laboratory examination: Secondary | ICD-10-CM | POA: Diagnosis present

## 2015-11-29 DIAGNOSIS — R1012 Left upper quadrant pain: Secondary | ICD-10-CM | POA: Diagnosis not present

## 2015-11-29 DIAGNOSIS — Z01810 Encounter for preprocedural cardiovascular examination: Secondary | ICD-10-CM | POA: Diagnosis present

## 2015-11-29 HISTORY — DX: Reserved for inherently not codable concepts without codable children: IMO0001

## 2015-11-29 HISTORY — DX: Gastro-esophageal reflux disease without esophagitis: K21.9

## 2015-11-29 LAB — TYPE AND SCREEN
ABO/RH(D): A POS
ANTIBODY SCREEN: NEGATIVE

## 2015-11-29 LAB — CBC
HEMATOCRIT: 33.8 % — AB (ref 35.0–47.0)
Hemoglobin: 11.6 g/dL — ABNORMAL LOW (ref 12.0–16.0)
MCH: 27.1 pg (ref 26.0–34.0)
MCHC: 34.4 g/dL (ref 32.0–36.0)
MCV: 78.8 fL — ABNORMAL LOW (ref 80.0–100.0)
PLATELETS: 241 10*3/uL (ref 150–440)
RBC: 4.29 MIL/uL (ref 3.80–5.20)
RDW: 14.8 % — AB (ref 11.5–14.5)
WBC: 5 10*3/uL (ref 3.6–11.0)

## 2015-11-29 LAB — BASIC METABOLIC PANEL
ANION GAP: 6 (ref 5–15)
BUN: 11 mg/dL (ref 6–20)
CALCIUM: 8.7 mg/dL — AB (ref 8.9–10.3)
CO2: 29 mmol/L (ref 22–32)
CREATININE: 0.84 mg/dL (ref 0.44–1.00)
Chloride: 104 mmol/L (ref 101–111)
Glucose, Bld: 105 mg/dL — ABNORMAL HIGH (ref 65–99)
Potassium: 3.5 mmol/L (ref 3.5–5.1)
SODIUM: 139 mmol/L (ref 135–145)

## 2015-11-29 LAB — ABO/RH: ABO/RH(D): A POS

## 2015-11-29 NOTE — Patient Instructions (Signed)
  Your procedure is scheduled on: 12/09/15 Thurs Report to Same Day Surgery 2nd floor medical mall To find out your arrival time please call [phone] between 1PM - 3PM on 12/08/15 Wed  Remember: Instructions that are not followed completely may result in serious medical risk, up to and including death, or upon the discretion of your surgeon and anesthesiologist your surgery may need to be rescheduled.    _x___ 1. Do not eat food or drink liquids after midnight. No gum chewing or hard candies.     ____ 2. No Alcohol for 24 hours before or after surgery.   ____ 3. Bring all medications with you on the day of surgery if instructed.    __x__ 4. Notify your doctor if there is any change in your medical condition     (cold, fever, infections).     Do not wear jewelry, make-up, hairpins, clips or nail polish.  Do not wear lotions, powders, or perfumes. You may wear deodorant.  Do not shave 48 hours prior to surgery. Men may shave face and neck.  Do not bring valuables to the hospital.    [facility] any belongings or valuables.               Contacts, dentures or bridgework may not be worn into surgery.  Leave your suitcase in the car. After surgery it may be brought to your room.  For patients admitted to the hospital, discharge time is determined by your treatment team.   Patients discharged the day of surgery will not be allowed to drive home.    Please read over the following fact sheets that you were given:   [facility] and or MRSA Information   _x___ Take these medicines the morning of surgery with A SIP OF WATER:    1. omeprazole (PRILOSEC) 40 MG capsule  2.  3.  4.  5.  6.  ____ Fleet Enema (as directed)   _x___ Use CHG Soap or sage wipes as directed on instruction sheet   ____ Use inhalers on the day of surgery and bring to hospital day of surgery  ____ Stop metformin 2 days prior to surgery    ____ Take 1/2 of usual  insulin dose the night before surgery and none on the morning of           surgery.   ____ Stop aspirin or coumadin, or plavix  _x__ Stop Anti-inflammatories such as Advil, Aleve, Ibuprofen, Motrin, Naproxen,          Naprosyn, Goodies powders or aspirin products. Ok to take Tylenol.   ____ Stop supplements until after surgery.    ____ Bring C-Pap to the hospital.

## 2015-12-09 ENCOUNTER — Ambulatory Visit: Payer: BLUE CROSS/BLUE SHIELD | Admitting: Anesthesiology

## 2015-12-09 ENCOUNTER — Encounter
Admission: RE | Disposition: A | Discharge status: Home / Self Care | Payer: Self-pay | Source: Ambulatory Visit | Attending: Obstetrics & Gynecology

## 2015-12-09 ENCOUNTER — Ambulatory Visit
Admission: RE | Admit: 2015-12-09 | Discharge: 2015-12-09 | Disposition: A | Discharge status: Home / Self Care | Payer: BLUE CROSS/BLUE SHIELD | Source: Ambulatory Visit | Attending: Obstetrics & Gynecology | Admitting: Obstetrics & Gynecology

## 2015-12-09 DIAGNOSIS — Z79899 Other long term (current) drug therapy: Secondary | ICD-10-CM | POA: Insufficient documentation

## 2015-12-09 DIAGNOSIS — Z882 Allergy status to sulfonamides status: Secondary | ICD-10-CM | POA: Insufficient documentation

## 2015-12-09 DIAGNOSIS — K449 Diaphragmatic hernia without obstruction or gangrene: Secondary | ICD-10-CM | POA: Diagnosis not present

## 2015-12-09 DIAGNOSIS — Z6841 Body Mass Index (BMI) 40.0 and over, adult: Secondary | ICD-10-CM | POA: Diagnosis not present

## 2015-12-09 DIAGNOSIS — D649 Anemia, unspecified: Secondary | ICD-10-CM | POA: Insufficient documentation

## 2015-12-09 DIAGNOSIS — K219 Gastro-esophageal reflux disease without esophagitis: Secondary | ICD-10-CM | POA: Insufficient documentation

## 2015-12-09 DIAGNOSIS — E669 Obesity, unspecified: Secondary | ICD-10-CM | POA: Insufficient documentation

## 2015-12-09 DIAGNOSIS — N888 Other specified noninflammatory disorders of cervix uteri: Secondary | ICD-10-CM | POA: Insufficient documentation

## 2015-12-09 DIAGNOSIS — N841 Polyp of cervix uteri: Secondary | ICD-10-CM | POA: Diagnosis not present

## 2015-12-09 DIAGNOSIS — Z8249 Family history of ischemic heart disease and other diseases of the circulatory system: Secondary | ICD-10-CM | POA: Insufficient documentation

## 2015-12-09 DIAGNOSIS — N838 Other noninflammatory disorders of ovary, fallopian tube and broad ligament: Secondary | ICD-10-CM | POA: Diagnosis not present

## 2015-12-09 DIAGNOSIS — N800 Endometriosis of uterus: Secondary | ICD-10-CM | POA: Diagnosis not present

## 2015-12-09 HISTORY — PX: CYSTOSCOPY: SHX5120

## 2015-12-09 HISTORY — PX: LAPAROSCOPIC HYSTERECTOMY: SHX1926

## 2015-12-09 LAB — POCT PREGNANCY, URINE: PREG TEST UR: NEGATIVE

## 2015-12-09 SURGERY — HYSTERECTOMY, TOTAL, LAPAROSCOPIC
Anesthesia: General | Laterality: Bilateral

## 2015-12-09 MED ORDER — BUPIVACAINE LIPOSOME 1.3 % IJ SUSP
8.0000 mL | Freq: Once | INTRAMUSCULAR | Status: DC
  Filled 2015-12-09: qty 20

## 2015-12-09 MED ORDER — IPRATROPIUM-ALBUTEROL 0.5-2.5 (3) MG/3ML IN SOLN
RESPIRATORY_TRACT | Status: AC
  Filled 2015-12-09: qty 3

## 2015-12-09 MED ORDER — ONDANSETRON HCL 4 MG/2ML IJ SOLN
INTRAMUSCULAR | Status: DC | PRN
  Administered 2015-12-09: 4 mg via INTRAVENOUS

## 2015-12-09 MED ORDER — BUPIVACAINE HCL (PF) 0.5 % IJ SOLN
INTRAMUSCULAR | Status: AC
  Filled 2015-12-09: qty 30

## 2015-12-09 MED ORDER — ACETAMINOPHEN 10 MG/ML IV SOLN
INTRAVENOUS | Status: AC
  Filled 2015-12-09: qty 100

## 2015-12-09 MED ORDER — ROCURONIUM BROMIDE 100 MG/10ML IV SOLN
INTRAVENOUS | Status: DC | PRN
  Administered 2015-12-09 (×2): 10 mg via INTRAVENOUS
  Administered 2015-12-09: 30 mg via INTRAVENOUS

## 2015-12-09 MED ORDER — ONDANSETRON HCL 4 MG/2ML IJ SOLN
4.0000 mg | Freq: Once | INTRAMUSCULAR | Status: AC | PRN
  Administered 2015-12-09 (×2): 4 mg via INTRAVENOUS

## 2015-12-09 MED ORDER — LACTATED RINGERS IV SOLN
INTRAVENOUS | Status: DC
  Administered 2015-12-09 (×2): via INTRAVENOUS

## 2015-12-09 MED ORDER — GLYCOPYRROLATE 0.2 MG/ML IJ SOLN
INTRAMUSCULAR | Status: DC | PRN
  Administered 2015-12-09: 0.6 mg via INTRAVENOUS
  Administered 2015-12-09: 0.2 mg via INTRAVENOUS

## 2015-12-09 MED ORDER — KETOROLAC TROMETHAMINE 30 MG/ML IJ SOLN
INTRAMUSCULAR | Status: DC | PRN
  Administered 2015-12-09: 30 mg via INTRAVENOUS

## 2015-12-09 MED ORDER — PROPOFOL 10 MG/ML IV BOLUS
INTRAVENOUS | Status: DC | PRN
  Administered 2015-12-09: 200 mg via INTRAVENOUS

## 2015-12-09 MED ORDER — FENTANYL CITRATE (PF) 100 MCG/2ML IJ SOLN
25.0000 ug | INTRAMUSCULAR | Status: AC | PRN
  Administered 2015-12-09 (×6): 25 ug via INTRAVENOUS

## 2015-12-09 MED ORDER — MORPHINE SULFATE (PF) 2 MG/ML IV SOLN
1.0000 mg | INTRAVENOUS | Status: DC | PRN
  Administered 2015-12-09 (×2): 1 mg via INTRAVENOUS

## 2015-12-09 MED ORDER — IPRATROPIUM-ALBUTEROL 0.5-2.5 (3) MG/3ML IN SOLN
3.0000 mL | Freq: Four times a day (QID) | RESPIRATORY_TRACT | Status: DC
  Administered 2015-12-09: 3 mL via RESPIRATORY_TRACT

## 2015-12-09 MED ORDER — NEOSTIGMINE METHYLSULFATE 10 MG/10ML IV SOLN
INTRAVENOUS | Status: DC | PRN
  Administered 2015-12-09: 4 mg via INTRAVENOUS

## 2015-12-09 MED ORDER — SUCCINYLCHOLINE CHLORIDE 20 MG/ML IJ SOLN
INTRAMUSCULAR | Status: DC | PRN
  Administered 2015-12-09: 100 mg via INTRAVENOUS

## 2015-12-09 MED ORDER — ONDANSETRON HCL 4 MG/2ML IJ SOLN
INTRAMUSCULAR | Status: AC
  Filled 2015-12-09: qty 2

## 2015-12-09 MED ORDER — BUPIVACAINE LIPOSOME 1.3 % IJ SUSP
20.0000 mL | Freq: Once | INTRAMUSCULAR | Status: AC
  Administered 2015-12-09: 266 mg
  Filled 2015-12-09: qty 20

## 2015-12-09 MED ORDER — PHENYLEPHRINE HCL 10 MG/ML IJ SOLN
INTRAMUSCULAR | Status: DC | PRN
  Administered 2015-12-09 (×2): 100 ug via INTRAVENOUS

## 2015-12-09 MED ORDER — EPHEDRINE SULFATE 50 MG/ML IJ SOLN
INTRAMUSCULAR | Status: DC | PRN
Start: 2015-12-09 — End: 2015-12-09
  Administered 2015-12-09: 7.5 mg via INTRAVENOUS

## 2015-12-09 MED ORDER — CEFAZOLIN SODIUM-DEXTROSE 2-4 GM/100ML-% IV SOLN
2.0000 g | Freq: Once | INTRAVENOUS | Status: AC
  Administered 2015-12-09: 2 g via INTRAVENOUS

## 2015-12-09 MED ORDER — ACETAMINOPHEN 10 MG/ML IV SOLN
INTRAVENOUS | Status: DC | PRN
  Administered 2015-12-09: 1000 mg via INTRAVENOUS

## 2015-12-09 MED ORDER — LIDOCAINE HCL (CARDIAC) 20 MG/ML IV SOLN
INTRAVENOUS | Status: DC | PRN
  Administered 2015-12-09: 50 mg via INTRAVENOUS

## 2015-12-09 MED ORDER — FENTANYL CITRATE (PF) 100 MCG/2ML IJ SOLN
INTRAMUSCULAR | Status: AC
  Administered 2015-12-09: 25 ug via INTRAVENOUS
  Filled 2015-12-09: qty 2

## 2015-12-09 MED ORDER — ONDANSETRON HCL 4 MG/2ML IJ SOLN
INTRAMUSCULAR | Status: AC
  Administered 2015-12-09: 4 mg via INTRAVENOUS
  Filled 2015-12-09: qty 2

## 2015-12-09 MED ORDER — FENTANYL CITRATE (PF) 100 MCG/2ML IJ SOLN
INTRAMUSCULAR | Status: DC | PRN
  Administered 2015-12-09 (×4): 50 ug via INTRAVENOUS

## 2015-12-09 MED ORDER — HEPARIN SODIUM (PORCINE) 5000 UNIT/ML IJ SOLN
5000.0000 [IU] | Freq: Once | INTRAMUSCULAR | Status: AC
  Administered 2015-12-09: 5000 [IU] via SUBCUTANEOUS

## 2015-12-09 MED ORDER — BUPIVACAINE LIPOSOME 1.3 % IJ SUSP
INTRAMUSCULAR | Status: AC
  Filled 2015-12-09: qty 20

## 2015-12-09 MED ORDER — MORPHINE SULFATE (PF) 2 MG/ML IV SOLN
INTRAVENOUS | Status: AC
  Filled 2015-12-09: qty 1

## 2015-12-09 MED ORDER — IPRATROPIUM-ALBUTEROL 0.5-2.5 (3) MG/3ML IN SOLN
3.0000 mL | Freq: Once | RESPIRATORY_TRACT | Status: AC
  Administered 2015-12-09: 3 mL via RESPIRATORY_TRACT

## 2015-12-09 MED ORDER — IPRATROPIUM-ALBUTEROL 0.5-2.5 (3) MG/3ML IN SOLN: 3.0000 mL | Freq: Four times a day (QID) | RESPIRATORY_TRACT | Status: DC

## 2015-12-09 MED ORDER — FUROSEMIDE 10 MG/ML IJ SOLN
INTRAMUSCULAR | Status: DC | PRN
  Administered 2015-12-09: 10 mg via INTRAMUSCULAR

## 2015-12-09 MED ORDER — MIDAZOLAM HCL 2 MG/2ML IJ SOLN
INTRAMUSCULAR | Status: DC | PRN
Start: 2015-12-09 — End: 2015-12-09
  Administered 2015-12-09: 2 mg via INTRAVENOUS

## 2015-12-09 SURGICAL SUPPLY — 55 items
BAG URO DRAIN 2000ML W/SPOUT (MISCELLANEOUS) ×4 IMPLANT
BLADE SAW SAG 29X58X.64 (BLADE) ×2 IMPLANT
BLADE SURG SZ11 CARB STEEL (BLADE) ×4 IMPLANT
CANISTER SUCT 1200ML W/VALVE (MISCELLANEOUS) ×4 IMPLANT
CATH FOLEY 2WAY  5CC 16FR (CATHETERS) ×2
CATH FOLEY 2WAY 5CC 16FR (CATHETERS) ×2
CATH URTH 16FR FL 2W BLN LF (CATHETERS) ×2 IMPLANT
CHLORAPREP W/TINT 26ML (MISCELLANEOUS) ×8 IMPLANT
DEFOGGER SCOPE WARMER CLEARIFY (MISCELLANEOUS) ×4 IMPLANT
DEVICE SUTURE ENDOST 10MM (ENDOMECHANICALS) ×4 IMPLANT
DRAPE LEGGINS SURG 28X43 STRL (DRAPES) ×4 IMPLANT
DRAPE SHEET LG 3/4 BI-LAMINATE (DRAPES) ×4 IMPLANT
DRAPE UNDER BUTTOCK W/FLU (DRAPES) ×4 IMPLANT
GLOVE PI ORTHOPRO 6.5 (GLOVE) ×8
GLOVE PI ORTHOPRO STRL 6.5 (GLOVE) ×8 IMPLANT
GLOVE SURG SYN 6.5 ES PF (GLOVE) ×16 IMPLANT
GLOVE SURG SYN 6.5 PF PI (GLOVE) ×8 IMPLANT
GOWN STRL REUS W/ TWL LRG LVL3 (GOWN DISPOSABLE) ×6 IMPLANT
GOWN STRL REUS W/ TWL XL LVL3 (GOWN DISPOSABLE) ×2 IMPLANT
GOWN STRL REUS W/TWL LRG LVL3 (GOWN DISPOSABLE) ×12
GOWN STRL REUS W/TWL XL LVL3 (GOWN DISPOSABLE) ×4
GRASPER SUT TROCAR 14GX15 (MISCELLANEOUS) ×2 IMPLANT
HIBICLENS CHG 4% 32OZ (MISCELLANEOUS) ×4 IMPLANT
IRRIGATION STRYKERFLOW (MISCELLANEOUS) ×2 IMPLANT
IRRIGATOR STRYKERFLOW (MISCELLANEOUS) ×4
IV LACTATED RINGERS 1000ML (IV SOLUTION) ×4 IMPLANT
KIT PINK PAD W/HEAD ARE REST (MISCELLANEOUS) ×4
KIT PINK PAD W/HEAD ARM REST (MISCELLANEOUS) ×2 IMPLANT
KIT RM TURNOVER CYSTO AR (KITS) ×4 IMPLANT
L-HOOK LAP DISP 36CM (ELECTROSURGICAL) ×4
LHOOK LAP DISP 36CM (ELECTROSURGICAL) ×2 IMPLANT
LIGASURE BLUNT 5MM 37CM (INSTRUMENTS) ×4 IMPLANT
LIQUID BAND (GAUZE/BANDAGES/DRESSINGS) ×4 IMPLANT
MANIPULATOR VCARE LG CRV RETR (MISCELLANEOUS) IMPLANT
MANIPULATOR VCARE SML CRV RETR (MISCELLANEOUS) IMPLANT
MANIPULATOR VCARE STD CRV RETR (MISCELLANEOUS) ×2 IMPLANT
NS IRRIG 500ML POUR BTL (IV SOLUTION) ×4 IMPLANT
PACK LAP CHOLECYSTECTOMY (MISCELLANEOUS) ×4 IMPLANT
PAD OB MATERNITY 4.3X12.25 (PERSONAL CARE ITEMS) ×4 IMPLANT
PAD PREP 24X41 OB/GYN DISP (PERSONAL CARE ITEMS) ×4 IMPLANT
PENCIL ELECTRO HAND CTR (MISCELLANEOUS) ×4 IMPLANT
SCISSORS METZENBAUM CVD 33 (INSTRUMENTS) IMPLANT
SET CYSTO W/LG BORE CLAMP LF (SET/KITS/TRAYS/PACK) IMPLANT
SLEEVE ENDOPATH XCEL 5M (ENDOMECHANICALS) ×4 IMPLANT
SPONGE XRAY 4X4 16PLY STRL (MISCELLANEOUS) ×2 IMPLANT
SURGILUBE 2OZ TUBE FLIPTOP (MISCELLANEOUS) ×4 IMPLANT
SUT DVC VLOC 180 0 12IN GS21 (SUTURE) ×4
SUT ENDO VLOC 180-0-8IN (SUTURE) ×2 IMPLANT
SUT VIC AB 0 CT1 36 (SUTURE) ×4 IMPLANT
SUT VIC AB 2-0 UR6 27 (SUTURE) ×4 IMPLANT
SUTURE DVC VLC 180 0 12IN GS21 (SUTURE) IMPLANT
TROCAR BLUNT TIP 12MM OMST12BT (TROCAR) ×4 IMPLANT
TROCAR ENDO BLADELESS 11MM (ENDOMECHANICALS) ×2 IMPLANT
TROCAR XCEL NON-BLD 5MMX100MML (ENDOMECHANICALS) ×4 IMPLANT
TUBING INSUFFLATOR HEATED (MISCELLANEOUS) ×4 IMPLANT

## 2015-12-09 NOTE — H&P (Signed)
H&P Update  PLEASE SEE PAPER H&P  Pt was last seen in my office, and complete history and physical performed.  The surgical history has been reviewed and remains accurate without interval change. The patient was re-examined and patient's physiologic condition has not changed significantly in the last 30 days.  No new pharmacological allergies or types of therapy has been initiated.  Allergies  Allergen Reactions  . Sulfa Antibiotics Rash    Past Medical History  Diagnosis Date  . Leukopenia   . Vitamin B 12 deficiency   . Polycystic ovarian disease   . GERD (gastroesophageal reflux disease)   . Hiatal hernia   . IDA (iron deficiency anemia)     iron infusion quarterly  . Shortness of breath dyspnea    Past Surgical History  Procedure Laterality Date  . Tonsillectomy    . Tmj arthroscopy  1995  . Cesarean section  2003, 2007    x 2  . Bone marrow biopsy  01/29/2008  . Colonoscopy  2009  . Esophagogastroduodenoscopy  2009    BP 146/99 mmHg  Pulse 92  Temp(Src) 96.1 F (35.6 C) (Oral)  Resp 18  Ht 5' 1"  (1.549 m)  Wt 99.791 kg (220 lb)  BMI 41.59 kg/m2  SpO2 99%  LMP 11/22/2015  NAD RRR no murmurs CTAB, no wheezing, resps unlabored +BS, soft, NTTP No c/c/e Pelvic exam deferred  The above history was confirmed with the patient. The condition still exists that makes this procedure necessary. Surgical plan includes Total laparoscopic hysterectomy, bilateral salpingectomy, as confirmed on the consent. The treatment plan remains the same, without new options for care.  The patient understands the potential benefits and risks and the consents have been signed and placed on the chart.     Larey Days, MD Attending Obstetrician Gynecologist [facility]

## 2015-12-09 NOTE — Anesthesia Preprocedure Evaluation (Addendum)
Anesthesia Evaluation  Patient identified by MRN, date of birth, ID band Patient awake    Reviewed: Allergy & Precautions, NPO status , Patient's Chart, lab work & pertinent test results, reviewed documented beta blocker date and time   Airway Mallampati: III  TM Distance: >3 FB     Dental  (+) Chipped   Pulmonary shortness of breath,           Cardiovascular      Neuro/Psych  Neuromuscular disease    GI/Hepatic   Endo/Other    Renal/GU      Musculoskeletal   Abdominal   Peds  Hematology  (+) anemia ,   Anesthesia Other Findings Obese. Can open mouth, but could be a difficult intubation. Coughing this am -  Will start duoneb. Anemic Hb 11.6. EKG OK.  Reproductive/Obstetrics                            Anesthesia Physical Anesthesia Plan  ASA: III  Anesthesia Plan: General   Post-op Pain Management:    Induction: Intravenous  Airway Management Planned: Oral ETT  Additional Equipment:   Intra-op Plan:   Post-operative Plan:   Informed Consent: I have reviewed the patients History and Physical, chart, labs and discussed the procedure including the risks, benefits and alternatives for the proposed anesthesia with the patient or authorized representative who has indicated his/her understanding and acceptance.     Plan Discussed with: CRNA  Anesthesia Plan Comments:         Anesthesia Quick Evaluation

## 2015-12-09 NOTE — Transfer of Care (Signed)
Immediate Anesthesia Transfer of Care Note  Patient: Leslie GlassmanSusan B Berg  Procedure(s) Performed: Procedure(s): HYSTERECTOMY TOTAL LAPAROSCOPIC/ BILATERAL SALPINGECTOMY (Bilateral) CYSTOSCOPY  Patient Location: PACU  Anesthesia Type:General  Level of Consciousness: awake  Airway & Oxygen Therapy: Patient Spontanous Breathing and Patient connected to face mask oxygen  Post-op Assessment: Report given to RN and Post -op Vital signs reviewed and stable  Post vital signs: Reviewed and stable  Last Vitals:  Filed Vitals:   12/09/15 1207 12/09/15 1222  BP: 128/76 114/69  Pulse: 79 64  Temp:    Resp: 16 15    Last Pain:  Filed Vitals:   12/09/15 1225  PainSc: 7          Complications: No apparent anesthesia complications

## 2015-12-09 NOTE — Anesthesia Postprocedure Evaluation (Signed)
Anesthesia Post Note  Patient: Leslie GlassmanSusan B Berg  Procedure(Berg) Performed: Procedure(Berg) (LRB): HYSTERECTOMY TOTAL LAPAROSCOPIC/ BILATERAL SALPINGECTOMY (Bilateral) CYSTOSCOPY  Patient location during evaluation: PACU Anesthesia Type: General Level of consciousness: awake and alert Pain management: pain level controlled Vital Signs Assessment: post-procedure vital signs reviewed and stable Respiratory status: spontaneous breathing, nonlabored ventilation, respiratory function stable and patient connected to nasal cannula oxygen Cardiovascular status: blood pressure returned to baseline and stable Postop Assessment: no signs of nausea or vomiting Anesthetic complications: no    Last Vitals:  Filed Vitals:   12/09/15 1440 12/09/15 1445  BP: 125/65   Pulse: 66 65  Temp:    Resp: 13 15    Last Pain:  Filed Vitals:   12/09/15 1446  PainSc: 6                  Leslie Berg

## 2015-12-09 NOTE — Op Note (Signed)
Total Laparoscopic Hysterectomy Operative Note Procedure Date: 12/09/2015  Patient:  Leslie Berg  50 y.o. female  PRE-OPERATIVE DIAGNOSIS:  CERVICAL MASS  POST-OPERATIVE DIAGNOSIS:  CERVICAL MASS  PROCEDURE:  Procedure(s): HYSTERECTOMY TOTAL LAPAROSCOPIC/ BILATERAL SALPINGECTOMY (Bilateral) CYSTOSCOPY  SURGEON:  Surgeon(s) and Role:    * Lejuan Botto C Sevyn Markham, MD - Primary    * Nadara Mustard, MD - Assisting  ANESTHESIA:  General via ET  I/O  Total I/O In: 1100 [I.V.:1100] Out: 1000 [Urine:900; Blood:100]  FINDINGS:  Small uterus, normal ovaries and fallopian tubes bilaterally.  Normal upper abdomen.  SPECIMEN: Uterus, Cervix, and bilateral fallopian tubes  COMPLICATIONS: none apparent  DISPOSITION: vital signs stable to PACU  Indication for Surgery: 50 y.o. who presented as an outpatient with the story of during the beginning her her menstrual cycle, she would have intense rectal pain for 2 days then would expel a large blood clot and have immediate relief of her pain.  On a recent cycle, she had a worsening of this pain and could not sit, saw her PCP and they sent her to the ED for a CT scan which showed a cervical mass.  An ultrasound showed a complex fluid filled mass in the cervix.  She then had the expulsion of her large blood clot and on repeat ultrasound the mass in the cervix had all but vanished, and only a 1-2cm simple cyst remained.  She has been having these symptoms and cycle for the last 9 years but only recently became as intense.  She was given options for hormonal manipulation to stop her periods, but elected to have a definitive hysterectomy instead.    Risks of surgery were discussed with the patient including but not limited to: bleeding which may require transfusion or reoperation; infection which may require antibiotics; injury to bowel, bladder, ureters or other surrounding organs; need for additional procedures including laparotomy, blood clot, incisional  problems and other postoperative/anesthesia complications. Written informed consent was obtained.      PROCEDURE IN DETAIL:  The patient had 5000u Heparin Sub-q and sequential compression devices applied to her lower extremities while in the preoperative area.  She was then taken to the operating room where general anesthesia was administered via endotracheal route.  She was placed in the dorsal lithotomy position, and was prepped and draped in a sterile manner. A surgical time-out was performed.  A Foley catheter was inserted into her bladder and attached to constant drainage and a V-Care uterine manipulator was then advanced into the uterus and a good fit around the cervix was noted. The gloves were changed, and attention was turned to the abdomen where an umbilical incision was made with the scalpel. A 5mm trochar was inserted in the umbilical incision using a visiport method and a pneumoperitoneum was created. A survey of the patient's pelvis and abdomen revealed the findings as mentioned above. Two 5mm ports were inserted in the lower left and right quadrants under visualization.    The bilateral fallopian tubes were separated from the mesosalpinx using the Ligasure. The bilateral round ligaments were transected and anterior broad ligament divided and brought across the uterus to separate the vesicouterine peritoneum and create a bladder flap. The bladder was pushed away from the uterus. The bilateral uterine arteries were ligated and transected. The bilateral uterosacral and cardinal ligaments were ligated and transected. A colpotomy was made around the V-Care cervical cup and the uterus, cervix, and bilateral tubes were removed through the vagina. I intended to repair  the vaginal cuff vaginally, but could not get a good visualization due to a long vagina. The left sided 5mm port was expanded to an 11mm port and a trochar was inserted.  The endostitch device was used with 0-V-lock suture in a  running fashion to close the vaginal cuff.   This was tested for integrity using the surgeon's finger.The pneumoperitoneum was deflated then recreated and surgical site inspected, and found to be hemostatic. The right ureter was visualized after the procedure ureters were visualized vermiuclating. The left ureter could not be identified trans-peritoneally.  No intraoperative injury to surrounding organs was noted. The abdomen was desufflated and all instruments were then removed.   The attention was turned to the bladder.  A 70-degree cystoscope was inserted into the urethra after the foley was removed.  The bladder was inflated with 300cc of normal saline, and inspection of the bladder wall was performed.  Bilateral ureteral jets were observed.  The cystoscope was removed.  All skin incisions were closed with 4-0 monocryl and covered with surgical glue. The patient tolerated the procedures well.  All instruments, needles, and sponge counts were correct x 2. The patient was taken to the recovery room in stable condition.   ---- Ranae Plumberhelsea Cannan Beeck, MD Attending Obstetrician and Gynecologist [facility]

## 2015-12-09 NOTE — Discharge Instructions (Signed)
Discharge instructions:  Call office if you have any of the following: fever >101 F, chills, excessive vaginal bleeding, incision drainage or problems, leg pain or redness, or any other concerns.   Activity: Do not lift > 10 lbs for 8 weeks.  No intercourse or tampons for 8 weeks.  No driving for 1-2 weeks.   You will have some pain in your right upper abdomen and right shoulder - this is due to the gas used to inflate your belly.  This will just go away with time, please be patient!  Please don't limit yourself in terms of routine activity.  You will be able to do most things, although they may take longer to do or be a little painful.  You can do it!  Don't be a hero, but don't be a wimp either!   AMBULATORY SURGERY  DISCHARGE INSTRUCTIONS   1) The drugs that you were given will stay in your system until tomorrow so for the next 24 hours you should not:  A) Drive an automobile B) Make any legal decisions C) Drink any alcoholic beverage   2) You may resume regular meals tomorrow.  Today it is better to start with liquids and gradually work up to solid foods.  You may eat anything you prefer, but it is better to start with liquids, then soup and crackers, and gradually work up to solid foods.   3) Please notify your doctor immediately if you have any unusual bleeding, trouble breathing, redness and pain at the surgery site, drainage, fever, or pain not relieved by medication.    4) Additional Instructions:  Please contact your physician with any problems or Same Day Surgery at [phone], Monday through Friday 6 am to 4 pm, or [place] at [facility] Main number at [phone].

## 2015-12-09 NOTE — Anesthesia Procedure Notes (Signed)
Procedure Name: Intubation Performed by: Jalal Rauch Pre-anesthesia Checklist: Patient identified, Patient being monitored, Timeout performed, Emergency Drugs available and Suction available Patient Re-evaluated:Patient Re-evaluated prior to inductionOxygen Delivery Method: Circle system utilized Preoxygenation: Pre-oxygenation with 100% oxygen Intubation Type: IV induction and Rapid sequence Ventilation: Mask ventilation without difficulty Laryngoscope Size: Miller and 2 Grade View: Grade I Tube type: Oral Tube size: 7.0 mm Number of attempts: 1 Airway Equipment and Method: Stylet Placement Confirmation: ETT inserted through vocal cords under direct vision,  positive ETCO2 and breath sounds checked- equal and bilateral Secured at: 21 cm Tube secured with: Tape Dental Injury: Teeth and Oropharynx as per pre-operative assessment      

## 2015-12-09 NOTE — Progress Notes (Signed)
In PACU, patient was having significant pain at 12mm trochar site.  Due to the procedure by which this site was closed, I do not suspect any bowel involvement, just muscle trapped in the closure.  Likely this is the source of her pain.    I injected 20mL Exparel and 15mL Bupivacaine into this site (sub-q and fascia/deep) and also at the other incision sites.    Patient was observed and after sufficient pain relief was sent over to 2nd stage recovery.  ----- Ranae Plumberhelsea Aerianna Losey, MD Attending Obstetrician and Gynecologist [facility]

## 2015-12-10 ENCOUNTER — Encounter: Payer: Self-pay | Admitting: Obstetrics & Gynecology

## 2015-12-10 LAB — SURGICAL PATHOLOGY

## 2016-02-24 ENCOUNTER — Inpatient Hospital Stay: Payer: BLUE CROSS/BLUE SHIELD

## 2016-02-25 ENCOUNTER — Inpatient Hospital Stay: Payer: BLUE CROSS/BLUE SHIELD | Admitting: Hematology and Oncology

## 2016-02-25 ENCOUNTER — Inpatient Hospital Stay: Payer: BLUE CROSS/BLUE SHIELD

## 2016-03-23 ENCOUNTER — Other Ambulatory Visit: Payer: Self-pay

## 2016-03-23 ENCOUNTER — Inpatient Hospital Stay: Payer: BLUE CROSS/BLUE SHIELD | Attending: Internal Medicine

## 2016-03-23 DIAGNOSIS — K449 Diaphragmatic hernia without obstruction or gangrene: Secondary | ICD-10-CM | POA: Insufficient documentation

## 2016-03-23 DIAGNOSIS — N920 Excessive and frequent menstruation with regular cycle: Secondary | ICD-10-CM | POA: Insufficient documentation

## 2016-03-23 DIAGNOSIS — E282 Polycystic ovarian syndrome: Secondary | ICD-10-CM | POA: Insufficient documentation

## 2016-03-23 DIAGNOSIS — Z90710 Acquired absence of both cervix and uterus: Secondary | ICD-10-CM | POA: Insufficient documentation

## 2016-03-23 DIAGNOSIS — D509 Iron deficiency anemia, unspecified: Secondary | ICD-10-CM | POA: Insufficient documentation

## 2016-03-23 DIAGNOSIS — K219 Gastro-esophageal reflux disease without esophagitis: Secondary | ICD-10-CM | POA: Insufficient documentation

## 2016-03-23 DIAGNOSIS — Z79899 Other long term (current) drug therapy: Secondary | ICD-10-CM | POA: Insufficient documentation

## 2016-03-23 LAB — IRON AND TIBC
Iron: 53 ug/dL (ref 28–170)
Saturation Ratios: 16 % (ref 10.4–31.8)
TIBC: 326 ug/dL (ref 250–450)
UIBC: 273 ug/dL

## 2016-03-23 LAB — CBC WITH DIFFERENTIAL/PLATELET
BASOS PCT: 1 %
Basophils Absolute: 0 10*3/uL (ref 0–0.1)
Eosinophils Absolute: 0.1 10*3/uL (ref 0–0.7)
Eosinophils Relative: 2 %
HEMATOCRIT: 35 % (ref 35.0–47.0)
HEMOGLOBIN: 12.7 g/dL (ref 12.0–16.0)
LYMPHS ABS: 1.6 10*3/uL (ref 1.0–3.6)
Lymphocytes Relative: 30 %
MCH: 29.3 pg (ref 26.0–34.0)
MCHC: 36.2 g/dL — AB (ref 32.0–36.0)
MCV: 80.9 fL (ref 80.0–100.0)
MONO ABS: 0.3 10*3/uL (ref 0.2–0.9)
MONOS PCT: 6 %
NEUTROS ABS: 3.3 10*3/uL (ref 1.4–6.5)
NEUTROS PCT: 61 %
Platelets: 237 10*3/uL (ref 150–440)
RBC: 4.33 MIL/uL (ref 3.80–5.20)
RDW: 13.9 % (ref 11.5–14.5)
WBC: 5.3 10*3/uL (ref 3.6–11.0)

## 2016-03-23 LAB — FERRITIN: Ferritin: 29 ng/mL (ref 11–307)

## 2016-03-24 ENCOUNTER — Encounter: Payer: Self-pay | Admitting: Internal Medicine

## 2016-03-24 ENCOUNTER — Inpatient Hospital Stay: Payer: BLUE CROSS/BLUE SHIELD

## 2016-03-24 ENCOUNTER — Inpatient Hospital Stay (HOSPITAL_BASED_OUTPATIENT_CLINIC_OR_DEPARTMENT_OTHER): Payer: BLUE CROSS/BLUE SHIELD | Admitting: Internal Medicine

## 2016-03-24 VITALS — BP 163/95 | HR 85 | Temp 97.4°F | Resp 19 | Ht 61.0 in | Wt 221.4 lb

## 2016-03-24 DIAGNOSIS — D509 Iron deficiency anemia, unspecified: Secondary | ICD-10-CM | POA: Diagnosis not present

## 2016-03-24 DIAGNOSIS — Z79899 Other long term (current) drug therapy: Secondary | ICD-10-CM | POA: Diagnosis not present

## 2016-03-24 DIAGNOSIS — N920 Excessive and frequent menstruation with regular cycle: Secondary | ICD-10-CM | POA: Diagnosis not present

## 2016-03-24 DIAGNOSIS — Z90710 Acquired absence of both cervix and uterus: Secondary | ICD-10-CM | POA: Diagnosis not present

## 2016-03-24 NOTE — Progress Notes (Signed)
Pt verbalizes her BP is always elevated before an infusion for being nervous.  Reports she had tingling in fingers for only one day in April.  Pt reports that she had a hysterectomy May 27th

## 2016-03-24 NOTE — Assessment & Plan Note (Addendum)
#   Iron deficiency Anemia- unclear etiology/question malabsorption/ menorrhagia. S/p IV ferrhem x in June 2017. No infusion today.   # Menorrhagia -s/p hysterectomy.  Follow-up with me in 6 months with labs/ possible ferriheme.  Patient will have  Labs done few days as prior.

## 2016-03-24 NOTE — Progress Notes (Signed)
[place] OFFICE PROGRESS NOTE  Patient Care Team: No Pcp Per Patient as PCP - General (General Practice)   SUMMARY OF ONCOLOGIC HISTORY:  # 2010-IRON DEFICIENCY ANEMIA; 2012- EGD/colo-NEG [Dr.Elliot] s/p IV Ferrahem [last 2015]; on Maint Venofer 200; Feb 2017-ferrhem q3M;   # menorraghia- s/p Hysterectomy [June 2017]  # Mild Leucopenia [2009-BMBx- normo-cellular-flow/cyto-neg]/ B12 def- on B12 shots  INTERVAL HISTORY:  50 year old female patient with long-standing history of iron deficiency anemia-not responsive to by mouth iron is here for follow-up.  Patient complains of fatigue. IV iron in June.  The interim patient had hysterectomy; no complications.   Denies any blood in stools black stools. Denies any weight loss.  REVIEW OF SYSTEMS:  A complete 10 point review of system is done which is negative except mentioned above/history of present illness.   PAST MEDICAL HISTORY :  Past Medical History:  Diagnosis Date  . GERD (gastroesophageal reflux disease)   . Hiatal hernia   . IDA (iron deficiency anemia)    iron infusion quarterly  . Leukopenia   . Polycystic ovarian disease   . Shortness of breath dyspnea   . Vitamin B 12 deficiency     PAST SURGICAL HISTORY :   Past Surgical History:  Procedure Laterality Date  . BONE MARROW BIOPSY  01/29/2008  . CESAREAN SECTION  2003, 2007   x 2  . COLONOSCOPY  2009  . CYSTOSCOPY  12/09/2015   Procedure: CYSTOSCOPY;  Surgeon: Honor Loh Ward, MD;  Location: [facility] ORS;  Service: Gynecology;;  . ESOPHAGOGASTRODUODENOSCOPY  2009  . LAPAROSCOPIC HYSTERECTOMY Bilateral 12/09/2015   Procedure: HYSTERECTOMY TOTAL LAPAROSCOPIC/ BILATERAL SALPINGECTOMY;  Surgeon: Honor Loh Ward, MD;  Location: [facility] ORS;  Service: Gynecology;  Laterality: Bilateral;  . TMJ ARTHROSCOPY  1995  . TONSILLECTOMY      FAMILY HISTORY :   Family History  Problem Relation Age of Onset  . CAD    . Ulcers    . Colon polyps Father   . Colon  polyps Mother     SOCIAL HISTORY:   Social History  Substance Use Topics  . Smoking status: Never Smoker  . Smokeless tobacco: Never Used  . Alcohol use No    ALLERGIES:  is allergic to sulfa antibiotics.  MEDICATIONS:  Current Outpatient Prescriptions  Medication Sig Dispense Refill  . omeprazole (PRILOSEC) 40 MG capsule Take by mouth.     No current facility-administered medications for this visit.     PHYSICAL EXAMINATION:  BP (!) 163/95 (BP Location: Right Arm, Patient Position: Sitting)   Pulse 85   Temp 97.4 F (36.3 C) (Tympanic)   Resp 19   Ht 5' 1"  (1.549 m)   Wt 221 lb 6.4 oz (100.4 kg)   BMI 41.83 kg/m   Filed Weights   03/24/16 1341  Weight: 221 lb 6.4 oz (100.4 kg)    GENERAL: Well-nourished well-developed; Alert, no distress and comfortable. She is alone. EYES: no pallor or icterus OROPHARYNX: no thrush or ulceration; good dentition  NECK: supple, no masses felt LYMPH:  no palpable lymphadenopathy in the cervical, axillary or inguinal regions LUNGS: clear to auscultation and  No wheeze or crackles HEART/CVS: regular rate & rhythm and no murmurs; No lower extremity edema ABDOMEN:abdomen soft, non-tender and normal bowel sounds Musculoskeletal:no cyanosis of digits and no clubbing  PSYCH: alert & oriented x 3 with fluent speech NEURO: no focal motor/sensory deficits SKIN:  no rashes or significant lesions   No results found for: SPEP,  UPEP  Lab Results  Component Value Date   WBC 5.3 03/23/2016   NEUTROABS 3.3 03/23/2016   HGB 12.7 03/23/2016   HCT 35.0 03/23/2016   MCV 80.9 03/23/2016   PLT 237 03/23/2016      Chemistry      Component Value Date/Time   NA 139 11/29/2015 0945   K 3.5 11/29/2015 0945   CL 104 11/29/2015 0945   CO2 29 11/29/2015 0945   BUN 11 11/29/2015 0945   CREATININE 0.84 11/29/2015 0945      Component Value Date/Time   CALCIUM 8.7 (L) 11/29/2015 0945   ALKPHOS 60 10/12/2015 1517   AST 18 10/12/2015 1517    ALT 18 10/12/2015 1517   BILITOT 0.4 10/12/2015 1517      ASSESSMENT & PLAN:   IDA (iron deficiency anemia) # Iron deficiency Anemia- unclear etiology/question malabsorption/ menorrhagia. S/p IV ferrhem x in June 2017. No infusion today.   # Menorrhagia -s/p hysterectomy.  Follow-up with me in 6 months with labs/ possible ferriheme.  Patient will have  Labs done few days as prior.       Cammie Sickle, MD 03/24/2016 2:01 PM

## 2016-08-05 ENCOUNTER — Encounter: Payer: Self-pay | Admitting: Emergency Medicine

## 2016-08-05 ENCOUNTER — Ambulatory Visit
Admission: EM | Admit: 2016-08-05 | Discharge: 2016-08-05 | Disposition: A | Discharge status: Home / Self Care | Payer: BLUE CROSS/BLUE SHIELD | Attending: Family Medicine | Admitting: Family Medicine

## 2016-08-05 DIAGNOSIS — J0100 Acute maxillary sinusitis, unspecified: Secondary | ICD-10-CM

## 2016-08-05 DIAGNOSIS — R6889 Other general symptoms and signs: Secondary | ICD-10-CM | POA: Diagnosis not present

## 2016-08-05 DIAGNOSIS — R0981 Nasal congestion: Secondary | ICD-10-CM | POA: Diagnosis not present

## 2016-08-05 LAB — RAPID INFLUENZA A&B ANTIGENS (ARMC ONLY)
INFLUENZA A (ARMC): NEGATIVE
INFLUENZA B (ARMC): NEGATIVE

## 2016-08-05 LAB — RAPID STREP SCREEN (MED CTR MEBANE ONLY): Streptococcus, Group A Screen (Direct): NEGATIVE

## 2016-08-05 MED ORDER — AMOXICILLIN-POT CLAVULANATE 875-125 MG PO TABS: 1.0000 | ORAL_TABLET | Freq: Two times a day (BID) | ORAL | 0 refills | Status: DC

## 2016-08-05 NOTE — Discharge Instructions (Signed)
Flonase, Saline nasal rinse and Allegra OTC as directed. Increase rest and Hydration

## 2016-08-05 NOTE — ED Triage Notes (Signed)
Patient c/o headache, facial pain and sinus congestion and teeth ache that started yesterday.

## 2016-08-05 NOTE — ED Provider Notes (Signed)
CSN: [id number]     Arrival date & time 08/05/16  0800 History   First MD Initiated Contact with Patient 08/05/16 [id number]     Chief Complaint  Patient presents with  . Headache  . Sinus Problem   (Consider location/radiation/quality/duration/timing/severity/associated sxs/prior Treatment) 51 yo female presented with CC of right sided facial pain, headache, fatigue, myalgia and generalized body aches x 1 day. Low grade fever last night. Denies cough/SOB. No flu shot this year.    Headache  Associated symptoms: fatigue, fever and sinus pressure   Associated symptoms: no cough   Sinus Problem  Associated symptoms include headaches. Pertinent negatives include no shortness of breath.    Past Medical History:  Diagnosis Date  . GERD (gastroesophageal reflux disease)   . Hiatal hernia   . IDA (iron deficiency anemia)    iron infusion quarterly  . Leukopenia   . Polycystic ovarian disease   . Shortness of breath dyspnea   . Vitamin B 12 deficiency    Past Surgical History:  Procedure Laterality Date  . BONE MARROW BIOPSY  01/29/2008  . CESAREAN SECTION  2003, 2007   x 2  . COLONOSCOPY  2009  . CYSTOSCOPY  12/09/2015   Procedure: CYSTOSCOPY;  Surgeon: Honor Loh Ward, MD;  Location: [facility] ORS;  Service: Gynecology;;  . ESOPHAGOGASTRODUODENOSCOPY  2009  . LAPAROSCOPIC HYSTERECTOMY Bilateral 12/09/2015   Procedure: HYSTERECTOMY TOTAL LAPAROSCOPIC/ BILATERAL SALPINGECTOMY;  Surgeon: Honor Loh Ward, MD;  Location: [facility] ORS;  Service: Gynecology;  Laterality: Bilateral;  . TMJ ARTHROSCOPY  1995  . TONSILLECTOMY     Family History  Problem Relation Age of Onset  . CAD    . Ulcers    . Colon polyps Father   . Colon polyps Mother    Social History  Substance Use Topics  . Smoking status: Never Smoker  . Smokeless tobacco: Never Used  . Alcohol use No   OB History    No data available     Review of Systems  Constitutional: Positive for fatigue and fever.  HENT: Positive for  sinus pain and sinus pressure.   Respiratory: Negative for cough, shortness of breath and wheezing.   Neurological: Positive for headaches.    Allergies  Allegra-d [fexofenadine-pseudoephed er] and Sulfa antibiotics  Home Medications   Prior to Admission medications   Medication Sig Start Date End Date Taking? Authorizing Provider  amoxicillin-clavulanate (AUGMENTIN) 875-125 MG tablet Take 1 tablet by mouth 2 (two) times daily. Patient may start 08/09/16 if no improvement in Sx. Do not fill after 08/31/16 08/09/16   Amanii Snethen, NP  omeprazole (PRILOSEC) 40 MG capsule Take by mouth.    Historical Provider, MD   Meds Ordered and Administered this Visit  Medications - No data to display  BP (!) 153/68 (BP Location: Left Arm)   Pulse 91   Temp 99 F (37.2 C) (Oral)   Resp 16   Ht _0  (1.549 m)   Wt 215 lb (97.5 kg)   LMP 11/22/2015   SpO2 100%   BMI 40.62 kg/m  No data found.   Physical Exam  Constitutional: She appears well-developed and well-nourished.  HENT:  Nose: Right sinus exhibits maxillary sinus tenderness and frontal sinus tenderness.    Eyes: Pupils are equal, round, and reactive to light.  Cardiovascular: Normal rate and regular rhythm.   Pulmonary/Chest: Effort normal and breath sounds normal. No respiratory distress. She has no wheezes.  Skin: Skin is warm.  Post Nasal drip with  B/L nasal mucosal inflammation.   Urgent Care Course     Procedures (including critical care time)  Labs Review Labs Reviewed  RAPID INFLUENZA A&B ANTIGENS ([facility] ONLY)  RAPID STREP SCREEN (NOT AT [facility])  CULTURE, GROUP A STREP [facility])    Imaging Review No results found.   Visual Acuity Review  Right Eye Distance:   Left Eye Distance:   Bilateral Distance:    Right Eye Near:   Left Eye Near:    Bilateral Near:         MDM   1. Nasal congestion   2. Acute non-recurrent maxillary sinusitis   Flu and Rapid Strep Negative. Pt advised to start Allegra  and Flonase, Use nasal saline rinse BID. If no improvement in Sx may start ABX as prescribed    Iveliz Garay, NP 08/05/16 5072

## 2016-08-08 LAB — CULTURE, GROUP A STREP (THRC)

## 2016-09-19 ENCOUNTER — Inpatient Hospital Stay: Payer: BLUE CROSS/BLUE SHIELD | Attending: Internal Medicine

## 2016-09-19 ENCOUNTER — Other Ambulatory Visit: Payer: Self-pay | Admitting: Internal Medicine

## 2016-09-19 DIAGNOSIS — K449 Diaphragmatic hernia without obstruction or gangrene: Secondary | ICD-10-CM | POA: Insufficient documentation

## 2016-09-19 DIAGNOSIS — E282 Polycystic ovarian syndrome: Secondary | ICD-10-CM | POA: Diagnosis not present

## 2016-09-19 DIAGNOSIS — K219 Gastro-esophageal reflux disease without esophagitis: Secondary | ICD-10-CM | POA: Diagnosis not present

## 2016-09-19 DIAGNOSIS — Z79899 Other long term (current) drug therapy: Secondary | ICD-10-CM | POA: Insufficient documentation

## 2016-09-19 DIAGNOSIS — D509 Iron deficiency anemia, unspecified: Secondary | ICD-10-CM | POA: Diagnosis not present

## 2016-09-19 LAB — CBC WITH DIFFERENTIAL/PLATELET
BASOS ABS: 0 10*3/uL (ref 0–0.1)
Basophils Relative: 1 %
EOS ABS: 0.1 10*3/uL (ref 0–0.7)
EOS PCT: 2 %
HCT: 37.7 % (ref 35.0–47.0)
Hemoglobin: 13.3 g/dL (ref 12.0–16.0)
Lymphocytes Relative: 33 %
Lymphs Abs: 1.9 10*3/uL (ref 1.0–3.6)
MCH: 29.2 pg (ref 26.0–34.0)
MCHC: 35.3 g/dL (ref 32.0–36.0)
MCV: 82.7 fL (ref 80.0–100.0)
MONO ABS: 0.3 10*3/uL (ref 0.2–0.9)
Monocytes Relative: 5 %
Neutro Abs: 3.4 10*3/uL (ref 1.4–6.5)
Neutrophils Relative %: 59 %
PLATELETS: 286 10*3/uL (ref 150–440)
RBC: 4.56 MIL/uL (ref 3.80–5.20)
RDW: 13.7 % (ref 11.5–14.5)
WBC: 5.8 10*3/uL (ref 3.6–11.0)

## 2016-09-19 LAB — IRON AND TIBC
Iron: 45 ug/dL (ref 28–170)
SATURATION RATIOS: 13 % (ref 10.4–31.8)
TIBC: 360 ug/dL (ref 250–450)
UIBC: 315 ug/dL

## 2016-09-19 LAB — FERRITIN: FERRITIN: 31 ng/mL (ref 11–307)

## 2016-09-21 ENCOUNTER — Ambulatory Visit: Payer: BLUE CROSS/BLUE SHIELD

## 2016-09-21 ENCOUNTER — Ambulatory Visit: Payer: BLUE CROSS/BLUE SHIELD | Admitting: Internal Medicine

## 2016-09-22 ENCOUNTER — Inpatient Hospital Stay: Payer: BLUE CROSS/BLUE SHIELD

## 2016-09-22 ENCOUNTER — Inpatient Hospital Stay (HOSPITAL_BASED_OUTPATIENT_CLINIC_OR_DEPARTMENT_OTHER): Payer: BLUE CROSS/BLUE SHIELD | Admitting: Internal Medicine

## 2016-09-22 VITALS — BP 153/95 | HR 73 | Temp 98.3°F | Resp 20 | Ht 61.0 in | Wt 226.5 lb

## 2016-09-22 DIAGNOSIS — D500 Iron deficiency anemia secondary to blood loss (chronic): Secondary | ICD-10-CM

## 2016-09-22 DIAGNOSIS — Z79899 Other long term (current) drug therapy: Secondary | ICD-10-CM

## 2016-09-22 DIAGNOSIS — D509 Iron deficiency anemia, unspecified: Secondary | ICD-10-CM | POA: Diagnosis not present

## 2016-09-22 MED ORDER — SODIUM CHLORIDE 0.9 % IV SOLN
510.0000 mg | Freq: Once | INTRAVENOUS | Status: AC
  Administered 2016-09-22: 510 mg via INTRAVENOUS
  Filled 2016-09-22: qty 17

## 2016-09-22 MED ORDER — SODIUM CHLORIDE 0.9 % IV SOLN
Freq: Once | INTRAVENOUS | Status: AC
  Administered 2016-09-22: 15:00:00 via INTRAVENOUS
  Filled 2016-09-22: qty 1000

## 2016-09-22 NOTE — Progress Notes (Signed)
[place] OFFICE PROGRESS NOTE  Patient Care Team: Tresea Mall, MD as PCP - General (Pediatrics)   SUMMARY OF ONCOLOGIC HISTORY:  # 2010-IRON DEFICIENCY ANEMIA; 2012- EGD/colo-NEG [Dr.Elliot] s/p IV Ferrahem [last 2015]; on Maint Venofer 200; Feb 2017-ferrhem q3M;   # menorraghia- s/p Hysterectomy [June 2017]  # Mild Leucopenia [2009-BMBx- normo-cellular-flow/cyto-neg]/ B12 def- on B12 shots  INTERVAL HISTORY:  51 year old female patient with long-standing history of iron deficiency anemia-not responsive to by mouth iron is here for follow-up.  Patient complains of fatigue; and shortness of breath especially on exertion.   Denies any blood in stools black stools. Denies any weight loss.   REVIEW OF SYSTEMS:  A complete 10 point review of system is done which is negative except mentioned above/history of present illness.   PAST MEDICAL HISTORY :  Past Medical History:  Diagnosis Date  . GERD (gastroesophageal reflux disease)   . Hiatal hernia   . IDA (iron deficiency anemia)    iron infusion quarterly  . Leukopenia   . Polycystic ovarian disease   . Shortness of breath dyspnea   . Vitamin B 12 deficiency     PAST SURGICAL HISTORY :   Past Surgical History:  Procedure Laterality Date  . BONE MARROW BIOPSY  01/29/2008  . CESAREAN SECTION  2003, 2007   x 2  . COLONOSCOPY  2009  . CYSTOSCOPY  12/09/2015   Procedure: CYSTOSCOPY;  Surgeon: Honor Loh Ward, MD;  Location: [facility];  Service: Gynecology;;  . ESOPHAGOGASTRODUODENOSCOPY  2009  . LAPAROSCOPIC HYSTERECTOMY Bilateral 12/09/2015   Procedure: HYSTERECTOMY TOTAL LAPAROSCOPIC/ BILATERAL SALPINGECTOMY;  Surgeon: Honor Loh Ward, MD;  Location: [facility];  Service: Gynecology;  Laterality: Bilateral;  . TMJ ARTHROSCOPY  1995  . TONSILLECTOMY      FAMILY HISTORY :   Family History  Problem Relation Age of Onset  . CAD    . Ulcers    . Colon polyps Father   . Colon polyps Mother     SOCIAL HISTORY:    Social History  Substance Use Topics  . Smoking status: Never Smoker  . Smokeless tobacco: Never Used  . Alcohol use No    ALLERGIES:  is allergic to allegra-d [fexofenadine-pseudoephed er] and sulfa antibiotics.  MEDICATIONS:  Current Outpatient Prescriptions  Medication Sig Dispense Refill  . omeprazole (PRILOSEC) 40 MG capsule Take by mouth.     No current facility-administered medications for this visit.     PHYSICAL EXAMINATION:  BP (!) 153/95 (BP Location: Left Arm, Patient Position: Sitting)   Pulse 73   Temp 98.3 F (36.8 C) (Tympanic)   Resp 20   Ht 5' 1"  (1.549 m)   Wt 226 lb 8 oz (102.7 kg)   LMP 11/29/2015   BMI 42.80 kg/m   Filed Weights   09/22/16 1414  Weight: 226 lb 8 oz (102.7 kg)    GENERAL: Well-nourished well-developed; Alert, no distress and comfortable. She is alone. EYES: no pallor or icterus OROPHARYNX: no thrush or ulceration; good dentition  NECK: supple, no masses felt LYMPH:  no palpable lymphadenopathy in the cervical, axillary or inguinal regions LUNGS: clear to auscultation and  No wheeze or crackles HEART/CVS: regular rate & rhythm and no murmurs; No lower extremity edema ABDOMEN:abdomen soft, non-tender and normal bowel sounds Musculoskeletal:no cyanosis of digits and no clubbing  PSYCH: alert & oriented x 3 with fluent speech NEURO: no focal motor/sensory deficits SKIN:  no rashes or significant lesions   No results found  for: SPEP, UPEP  Lab Results  Component Value Date   WBC 5.8 09/19/2016   NEUTROABS 3.4 09/19/2016   HGB 13.3 09/19/2016   HCT 37.7 09/19/2016   MCV 82.7 09/19/2016   PLT 286 09/19/2016      Chemistry      Component Value Date/Time   NA 139 11/29/2015 0945   K 3.5 11/29/2015 0945   CL 104 11/29/2015 0945   CO2 29 11/29/2015 0945   BUN 11 11/29/2015 0945   CREATININE 0.84 11/29/2015 0945      Component Value Date/Time   CALCIUM 8.7 (L) 11/29/2015 0945   ALKPHOS 60 10/12/2015 1517   AST 18  10/12/2015 1517   ALT 18 10/12/2015 1517   BILITOT 0.4 10/12/2015 1517      ASSESSMENT & PLAN:   Iron deficiency anemia due to chronic blood loss # Iron deficiency Anemia- unclear etiology/question malabsorption/ ? chroniic blood loss.  S/p IV ferrahem x in June 2017.   # Hemoglobin is 13; saturation is 13% and ferritin is 30- patient seems to have symptoms of functional iron deficiency Proceed with IV infusion today.   Follow-up with me in 6 months with labs/ possible ferriheme.  Patient will have  Labs done few days as prior.       Cammie Sickle, MD 09/22/2016 4:14 PM

## 2016-09-22 NOTE — Assessment & Plan Note (Addendum)
#   Iron deficiency Anemia- unclear etiology/question malabsorption/ ? chroniic blood loss.  S/p IV ferrahem x in June 2017.   # Hemoglobin is 13; saturation is 13% and ferritin is 30- patient seems to have symptoms of functional iron deficiency Proceed with IV infusion today.   Follow-up with me in 6 months with labs/ possible ferriheme.  Patient will have  Labs done few days as prior.

## 2017-03-21 ENCOUNTER — Inpatient Hospital Stay: Payer: BLUE CROSS/BLUE SHIELD | Attending: Internal Medicine

## 2017-03-21 DIAGNOSIS — Z7982 Long term (current) use of aspirin: Secondary | ICD-10-CM | POA: Diagnosis not present

## 2017-03-21 DIAGNOSIS — D509 Iron deficiency anemia, unspecified: Secondary | ICD-10-CM | POA: Insufficient documentation

## 2017-03-21 DIAGNOSIS — K219 Gastro-esophageal reflux disease without esophagitis: Secondary | ICD-10-CM | POA: Diagnosis not present

## 2017-03-21 DIAGNOSIS — Z79899 Other long term (current) drug therapy: Secondary | ICD-10-CM | POA: Insufficient documentation

## 2017-03-21 DIAGNOSIS — D500 Iron deficiency anemia secondary to blood loss (chronic): Secondary | ICD-10-CM

## 2017-03-21 DIAGNOSIS — K449 Diaphragmatic hernia without obstruction or gangrene: Secondary | ICD-10-CM | POA: Diagnosis not present

## 2017-03-21 DIAGNOSIS — E282 Polycystic ovarian syndrome: Secondary | ICD-10-CM | POA: Diagnosis not present

## 2017-03-21 LAB — FERRITIN: Ferritin: 79 ng/mL (ref 11–307)

## 2017-03-21 LAB — BASIC METABOLIC PANEL
Anion gap: 9 (ref 5–15)
BUN: 12 mg/dL (ref 6–20)
CO2: 27 mmol/L (ref 22–32)
Calcium: 8.8 mg/dL — ABNORMAL LOW (ref 8.9–10.3)
Chloride: 100 mmol/L — ABNORMAL LOW (ref 101–111)
Creatinine, Ser: 0.86 mg/dL (ref 0.44–1.00)
GFR calc Af Amer: >60 mL/min (ref >60)
GFR calc non Af Amer: >60 mL/min (ref >60)
Glucose, Bld: 94 mg/dL (ref 65–99)
POTASSIUM: 3.6 mmol/L (ref 3.5–5.1)
SODIUM: 136 mmol/L (ref 135–145)

## 2017-03-21 LAB — IRON AND TIBC
Iron: 57 ug/dL (ref 28–170)
SATURATION RATIOS: 18 % (ref 10.4–31.8)
TIBC: 317 ug/dL (ref 250–450)
UIBC: 260 ug/dL

## 2017-03-21 LAB — CBC WITH DIFFERENTIAL/PLATELET
BASOS ABS: 0 10*3/uL (ref 0–0.1)
Basophils Relative: 1 %
EOS PCT: 2 %
Eosinophils Absolute: 0.1 10*3/uL (ref 0–0.7)
HCT: 36.1 % (ref 35.0–47.0)
Hemoglobin: 13 g/dL (ref 12.0–16.0)
LYMPHS PCT: 35 %
Lymphs Abs: 1.8 10*3/uL (ref 1.0–3.6)
MCH: 29.9 pg (ref 26.0–34.0)
MCHC: 36.1 g/dL — ABNORMAL HIGH (ref 32.0–36.0)
MCV: 82.9 fL (ref 80.0–100.0)
MONO ABS: 0.3 10*3/uL (ref 0.2–0.9)
Monocytes Relative: 7 %
NEUTROS ABS: 2.9 10*3/uL (ref 1.4–6.5)
Neutrophils Relative %: 55 %
PLATELETS: 262 10*3/uL (ref 150–440)
RBC: 4.35 MIL/uL (ref 3.80–5.20)
RDW: 13.6 % (ref 11.5–14.5)
WBC: 5.1 10*3/uL (ref 3.6–11.0)

## 2017-03-23 ENCOUNTER — Inpatient Hospital Stay (HOSPITAL_BASED_OUTPATIENT_CLINIC_OR_DEPARTMENT_OTHER): Payer: BLUE CROSS/BLUE SHIELD | Admitting: Internal Medicine

## 2017-03-23 ENCOUNTER — Inpatient Hospital Stay: Payer: BLUE CROSS/BLUE SHIELD

## 2017-03-23 VITALS — BP 152/86 | HR 75 | Resp 16 | Wt 228.4 lb

## 2017-03-23 DIAGNOSIS — Z79899 Other long term (current) drug therapy: Secondary | ICD-10-CM

## 2017-03-23 DIAGNOSIS — D509 Iron deficiency anemia, unspecified: Secondary | ICD-10-CM

## 2017-03-23 DIAGNOSIS — D500 Iron deficiency anemia secondary to blood loss (chronic): Secondary | ICD-10-CM

## 2017-03-23 NOTE — Assessment & Plan Note (Addendum)
#   Iron deficiency Anemia- unclear etiology/question malabsorption/ ? chroniic blood loss.  S/p IV ferrahem x in June 2017. Mild-to-moderate fatigue.  # Hemoglobin is 13; saturation is 18% and ferritin is 78- patient seems to have symptoms of functional iron deficiency Proceed with IV infusion today.   # follow up in 12 months/labs or sooner if needed.

## 2017-03-23 NOTE — Progress Notes (Signed)
Unable to obtain peripheral IV access. MD, Dr. Donneta RombergBrahmanday, notified via telephone and aware. Per MD order: Discharge patient to home and have Feraheme appointment rescheduled for next week. Patient notified and encouraged to drink plenty of fluids prior to appointment.

## 2017-03-23 NOTE — Progress Notes (Signed)
[place] OFFICE PROGRESS NOTE  Patient Care Team: Tresea Mall, MD as PCP - General (Pediatrics)   SUMMARY OF ONCOLOGIC HISTORY:  # 2010-IRON DEFICIENCY ANEMIA; 2012- EGD/colo-NEG [Dr.Elliot] s/p IV Ferrahem [last 2015]; on Maint Venofer 200; Feb 2017-ferrhem q3M;   # menorraghia- s/p Hysterectomy [June 2017]  # Mild Leucopenia [2009-BMBx- normo-cellular-flow/cyto-neg]/ B12 def- on B12 shots  INTERVAL HISTORY:  51 year old female patient with long-standing history of iron deficiency anemia-not responsive to by mouth iron is here for follow-up.  Patient complains of fatigue; and shortness of breath especially on exertion.   Denies any blood in stools black stools. Denies any weight loss.   REVIEW OF SYSTEMS:  A complete 10 point review of system is done which is negative except mentioned above/history of present illness.   PAST MEDICAL HISTORY :  Past Medical History:  Diagnosis Date  . GERD (gastroesophageal reflux disease)   . Hiatal hernia   . IDA (iron deficiency anemia)    iron infusion quarterly  . Leukopenia   . Polycystic ovarian disease   . Shortness of breath dyspnea   . Vitamin B 12 deficiency     PAST SURGICAL HISTORY :   Past Surgical History:  Procedure Laterality Date  . BONE MARROW BIOPSY  01/29/2008  . CESAREAN SECTION  2003, 2007   x 2  . COLONOSCOPY  2009  . CYSTOSCOPY  12/09/2015   Procedure: CYSTOSCOPY;  Surgeon: Honor Loh Ward, MD;  Location: [facility];  Service: Gynecology;;  . ESOPHAGOGASTRODUODENOSCOPY  2009  . LAPAROSCOPIC HYSTERECTOMY Bilateral 12/09/2015   Procedure: HYSTERECTOMY TOTAL LAPAROSCOPIC/ BILATERAL SALPINGECTOMY;  Surgeon: Honor Loh Ward, MD;  Location: [facility];  Service: Gynecology;  Laterality: Bilateral;  . TMJ ARTHROSCOPY  1995  . TONSILLECTOMY      FAMILY HISTORY :   Family History  Problem Relation Age of Onset  . CAD Unknown   . Ulcers Unknown   . Colon polyps Father   . Colon polyps Mother      SOCIAL HISTORY:   Social History  Substance Use Topics  . Smoking status: Never Smoker  . Smokeless tobacco: Never Used  . Alcohol use No    ALLERGIES:  is allergic to allegra-d [fexofenadine-pseudoephed er] and sulfa antibiotics.  MEDICATIONS:  Current Outpatient Prescriptions  Medication Sig Dispense Refill  . aspirin-acetaminophen-caffeine (EXCEDRIN MIGRAINE) 250-250-65 MG tablet Take 2 tablets by mouth every 6 (six) hours as needed for headache.    Marland Kitchen omeprazole (PRILOSEC) 40 MG capsule Take by mouth.     No current facility-administered medications for this visit.     PHYSICAL EXAMINATION:  BP (!) 152/86 (BP Location: Left Arm, Patient Position: Sitting)   Pulse 75   Resp 16   Wt 228 lb 6.4 oz (103.6 kg)   LMP 11/29/2015   BMI 43.16 kg/m   Filed Weights   03/23/17 1439  Weight: 228 lb 6.4 oz (103.6 kg)    GENERAL: Well-nourished well-developed; Alert, no distress and comfortable. She is alone. EYES: no pallor or icterus OROPHARYNX: no thrush or ulceration; good dentition  NECK: supple, no masses felt LYMPH:  no palpable lymphadenopathy in the cervical, axillary or inguinal regions LUNGS: clear to auscultation and  No wheeze or crackles HEART/CVS: regular rate & rhythm and no murmurs; No lower extremity edema ABDOMEN:abdomen soft, non-tender and normal bowel sounds Musculoskeletal:no cyanosis of digits and no clubbing  PSYCH: alert & oriented x 3 with fluent speech NEURO: no focal motor/sensory deficits SKIN:  no rashes  or significant lesions   No results found for: SPEP, UPEP  Lab Results  Component Value Date   WBC 5.1 03/21/2017   NEUTROABS 2.9 03/21/2017   HGB 13.0 03/21/2017   HCT 36.1 03/21/2017   MCV 82.9 03/21/2017   PLT 262 03/21/2017      Chemistry      Component Value Date/Time   NA 136 03/21/2017 1441   K 3.6 03/21/2017 1441   CL 100 (L) 03/21/2017 1441   CO2 27 03/21/2017 1441   BUN 12 03/21/2017 1441   CREATININE 0.86  03/21/2017 1441      Component Value Date/Time   CALCIUM 8.8 (L) 03/21/2017 1441   ALKPHOS 60 10/12/2015 1517   AST 18 10/12/2015 1517   ALT 18 10/12/2015 1517   BILITOT 0.4 10/12/2015 1517      ASSESSMENT & PLAN:   Iron deficiency anemia due to chronic blood loss # Iron deficiency Anemia- unclear etiology/question malabsorption/ ? chroniic blood loss.  S/p IV ferrahem x in June 2017. Mild-to-moderate fatigue.  # Hemoglobin is 13; saturation is 18% and ferritin is 78- patient seems to have symptoms of functional iron deficiency Proceed with IV infusion today.   # follow up in 12 months/labs or sooner if needed.       Cammie Sickle, MD 04/01/2017 7:29 PM

## 2017-03-28 ENCOUNTER — Inpatient Hospital Stay: Payer: BLUE CROSS/BLUE SHIELD

## 2017-03-28 ENCOUNTER — Telehealth: Payer: Self-pay | Admitting: *Deleted

## 2017-03-28 NOTE — Telephone Encounter (Signed)
Spoke with patient. IV attempt-venous access for IV fereheme. Spoke with Dr. Donneta RombergBrahmanday  - pt was given choice at last office apt of whether to proceed with IV iron vs f/u in 1 year. Patient had elected to proceed with IV fereheme. Patient has poor venous access and given the multiple attempts today and at the last visit for IV iron, a decision was made by Dr. Donneta RombergBrahmanday and the patient today to hold off on any further IV iron treatments at this time. Will f/u with pt in 1 year's time with iron labs. Pt gave verbal understanding of the plan of care.

## 2018-03-22 ENCOUNTER — Inpatient Hospital Stay: Payer: BLUE CROSS/BLUE SHIELD | Attending: Internal Medicine

## 2018-03-26 ENCOUNTER — Inpatient Hospital Stay: Payer: BLUE CROSS/BLUE SHIELD | Admitting: Internal Medicine

## 2018-03-26 ENCOUNTER — Inpatient Hospital Stay: Payer: BLUE CROSS/BLUE SHIELD

## 2018-03-26 NOTE — Assessment & Plan Note (Deleted)
#   Iron deficiency Anemia- unclear etiology/question malabsorption/ ? chroniic blood loss.  S/p IV ferrahem x in June 2017. Mild-to-moderate fatigue.  # Hemoglobin is 13; saturation is 18% and ferritin is 78- patient seems to have symptoms of functional iron deficiency Proceed with IV infusion today.   # follow up in 12 months/labs or sooner if needed. 

## 2018-03-26 NOTE — Progress Notes (Deleted)
[place] OFFICE PROGRESS NOTE  Patient Care Team: Tresea Mall, MD as PCP - General (Pediatrics)   SUMMARY OF ONCOLOGIC HISTORY:  # 2010-IRON DEFICIENCY ANEMIA; 2012- EGD/colo-NEG [Dr.Elliot] s/p IV Ferrahem [last 2015]; on Maint Venofer 200; Feb 2017-ferrhem q3M;   # menorraghia- s/p Hysterectomy [June 2017]  # Mild Leucopenia [2009-BMBx- normo-cellular-flow/cyto-neg]/ B12 def- on B12 shots  INTERVAL HISTORY:  52 year old female patient with long-standing history of iron deficiency anemia-not responsive to by mouth iron is here for follow-up.  Patient complains of fatigue; and shortness of breath especially on exertion.   Denies any blood in stools black stools. Denies any weight loss.   REVIEW OF SYSTEMS:  A complete 10 point review of system is done which is negative except mentioned above/history of present illness.   PAST MEDICAL HISTORY :  Past Medical History:  Diagnosis Date  . GERD (gastroesophageal reflux disease)   . Hiatal hernia   . IDA (iron deficiency anemia)    iron infusion quarterly  . Leukopenia   . Polycystic ovarian disease   . Shortness of breath dyspnea   . Vitamin B 12 deficiency     PAST SURGICAL HISTORY :   Past Surgical History:  Procedure Laterality Date  . BONE MARROW BIOPSY  01/29/2008  . CESAREAN SECTION  2003, 2007   x 2  . COLONOSCOPY  2009  . CYSTOSCOPY  12/09/2015   Procedure: CYSTOSCOPY;  Surgeon: Honor Loh Ward, MD;  Location: [facility];  Service: Gynecology;;  . ESOPHAGOGASTRODUODENOSCOPY  2009  . LAPAROSCOPIC HYSTERECTOMY Bilateral 12/09/2015   Procedure: HYSTERECTOMY TOTAL LAPAROSCOPIC/ BILATERAL SALPINGECTOMY;  Surgeon: Honor Loh Ward, MD;  Location: [facility];  Service: Gynecology;  Laterality: Bilateral;  . TMJ ARTHROSCOPY  1995  . TONSILLECTOMY      FAMILY HISTORY :   Family History  Problem Relation Age of Onset  . CAD Unknown   . Ulcers Unknown   . Colon polyps Father   . Colon polyps Mother      SOCIAL HISTORY:   Social History   Tobacco Use  . Smoking status: Never Smoker  . Smokeless tobacco: Never Used  Substance Use Topics  . Alcohol use: No    Alcohol/week: 0.0 standard drinks  . Drug use: No    ALLERGIES:  is allergic to allegra-d [fexofenadine-pseudoephed er] and sulfa antibiotics.  MEDICATIONS:  Current Outpatient Medications  Medication Sig Dispense Refill  . aspirin-acetaminophen-caffeine (EXCEDRIN MIGRAINE) 250-250-65 MG tablet Take 2 tablets by mouth every 6 (six) hours as needed for headache.    Marland Kitchen omeprazole (PRILOSEC) 40 MG capsule Take by mouth.     No current facility-administered medications for this visit.     PHYSICAL EXAMINATION:  LMP 11/29/2015   There were no vitals filed for this visit.  GENERAL: Well-nourished well-developed; Alert, no distress and comfortable. She is alone. EYES: no pallor or icterus OROPHARYNX: no thrush or ulceration; good dentition  NECK: supple, no masses felt LYMPH:  no palpable lymphadenopathy in the cervical, axillary or inguinal regions LUNGS: clear to auscultation and  No wheeze or crackles HEART/CVS: regular rate & rhythm and no murmurs; No lower extremity edema ABDOMEN:abdomen soft, non-tender and normal bowel sounds Musculoskeletal:no cyanosis of digits and no clubbing  PSYCH: alert & oriented x 3 with fluent speech NEURO: no focal motor/sensory deficits SKIN:  no rashes or significant lesions   No results found for: SPEP, UPEP  Lab Results  Component Value Date   WBC 5.1 03/21/2017   NEUTROABS  2.9 03/21/2017   HGB 13.0 03/21/2017   HCT 36.1 03/21/2017   MCV 82.9 03/21/2017   PLT 262 03/21/2017      Chemistry      Component Value Date/Time   NA 136 03/21/2017 1441   K 3.6 03/21/2017 1441   CL 100 (L) 03/21/2017 1441   CO2 27 03/21/2017 1441   BUN 12 03/21/2017 1441   CREATININE 0.86 03/21/2017 1441      Component Value Date/Time   CALCIUM 8.8 (L) 03/21/2017 1441   ALKPHOS 60  10/12/2015 1517   AST 18 10/12/2015 1517   ALT 18 10/12/2015 1517   BILITOT 0.4 10/12/2015 1517      ASSESSMENT & PLAN:   No problem-specific Assessment & Plan notes found for this encounter.       Cammie Sickle, MD 03/26/2018 11:32 AM

## 2019-12-06 ENCOUNTER — Ambulatory Visit: Payer: Self-pay | Attending: Internal Medicine

## 2019-12-06 DIAGNOSIS — Z23 Encounter for immunization: Secondary | ICD-10-CM

## 2019-12-06 NOTE — Progress Notes (Signed)
   Covid-19 Vaccination Clinic  Name:  Leslie Berg    MRN: [id number] DOB: 11-23-1965  12/06/2019  Ms. Hunger was observed post Covid-19 immunization for 15 minutes without incident. She was provided with Vaccine Information Sheet and instruction to access the V-Safe system.   [name] was instructed to call 911 with any severe reactions post vaccine: Marland Kitchen Difficulty breathing  . Swelling of face and throat  . A fast heartbeat  . A bad rash all over body  . Dizziness and weakness   Immunizations Administered    Name Date Dose VIS Date Route   Pfizer COVID-19 Vaccine 12/06/2019 10:31 AM 0.3 mL 09/10/2018 Intramuscular   Manufacturer: [employer], [employer]   Lot: [id number]   NDC: [address]-1

## 2021-05-25 ENCOUNTER — Other Ambulatory Visit: Payer: Self-pay

## 2021-05-25 ENCOUNTER — Ambulatory Visit
Admission: EM | Admit: 2021-05-25 | Discharge: 2021-05-25 | Disposition: A | Discharge status: Home / Self Care | Payer: BC Managed Care – PPO | Attending: Emergency Medicine | Admitting: Emergency Medicine

## 2021-05-25 ENCOUNTER — Telehealth: Payer: Self-pay | Admitting: Emergency Medicine

## 2021-05-25 ENCOUNTER — Encounter: Payer: Self-pay | Admitting: Internal Medicine

## 2021-05-25 ENCOUNTER — Ambulatory Visit (INDEPENDENT_AMBULATORY_CARE_PROVIDER_SITE_OTHER): Payer: BC Managed Care – PPO

## 2021-05-25 DIAGNOSIS — Z20822 Contact with and (suspected) exposure to covid-19: Secondary | ICD-10-CM

## 2021-05-25 DIAGNOSIS — J101 Influenza due to other identified influenza virus with other respiratory manifestations: Secondary | ICD-10-CM | POA: Insufficient documentation

## 2021-05-25 DIAGNOSIS — R509 Fever, unspecified: Secondary | ICD-10-CM | POA: Diagnosis not present

## 2021-05-25 DIAGNOSIS — R059 Cough, unspecified: Secondary | ICD-10-CM | POA: Diagnosis not present

## 2021-05-25 LAB — RAPID INFLUENZA A&B ANTIGENS
Influenza A (ARMC): NEGATIVE
Influenza B (ARMC): NEGATIVE

## 2021-05-25 LAB — RESP PANEL BY RT-PCR (FLU A&B, COVID) ARPGX2
Influenza A by PCR: POSITIVE — AB
Influenza B by PCR: NEGATIVE
SARS Coronavirus 2 by RT PCR: NEGATIVE

## 2021-05-25 MED ORDER — AEROCHAMBER PLUS MISC: 2 refills | Status: DC

## 2021-05-25 MED ORDER — IBUPROFEN 800 MG PO TABS
800.0000 mg | ORAL_TABLET | Freq: Once | ORAL | Status: AC
  Administered 2021-05-25: 800 mg via ORAL

## 2021-05-25 MED ORDER — OSELTAMIVIR PHOSPHATE 75 MG PO CAPS: 75.0000 mg | ORAL_CAPSULE | Freq: Two times a day (BID) | ORAL | 0 refills | Status: DC

## 2021-05-25 MED ORDER — HYDROCOD POLST-CPM POLST ER 10-8 MG/5ML PO SUER: 5.0000 mL | Freq: Two times a day (BID) | ORAL | 0 refills | Status: DC | PRN

## 2021-05-25 MED ORDER — IBUPROFEN 600 MG PO TABS: 600.0000 mg | ORAL_TABLET | Freq: Four times a day (QID) | ORAL | 0 refills | Status: AC | PRN

## 2021-05-25 MED ORDER — ALBUTEROL SULFATE HFA 108 (90 BASE) MCG/ACT IN AERS: 1.0000 | INHALATION_SPRAY | RESPIRATORY_TRACT | 0 refills | Status: DC | PRN

## 2021-05-25 MED ORDER — ACETAMINOPHEN 500 MG PO TABS
1000.0000 mg | ORAL_TABLET | Freq: Once | ORAL | Status: AC
  Administered 2021-05-25: 1000 mg via ORAL

## 2021-05-25 NOTE — Discharge Instructions (Addendum)
Your chest x-ray is negative for pneumonia.  Your COVID and flu testing should be back in about 30 minutes.  I will contact you and prescribe Tamiflu if it and flu is positive and Molnupiravir if COVID is positive.  In the meantime, take 600 mg of ibuprofen, 1000 mg of Tylenol together 3-4 times a day as needed for body aches, headaches.  Push plenty of extra fluids.  Continue Mucinex.  2 puffs from your albuterol inhaler using your spacer every 4 hours for 2 days, then every 6 hours for 2 days, then as needed.  May back off on this if you start to feel better sooner.  Tussionex for the cough.  Here is a list of primary care providers who are taking new patients:  Dr. Elizabeth Sauer [address] [place] [address] [phone]  [facility] at [facility] [address] [place], [place] [address] [phone]  [facility] [place] [address] [place] [address]  [phone]  [facility] [address] [place], [place] [address] [phone]  [facility] [address] [place]  [phone] [place], [place] [address]  Here are clinics/ other resources who will see you if you do not have insurance. Some have certain criteria that you must meet. Call them and find out what they are:  [facility]: [address], [place], [place] [address] Phone: [phone] Hours: First and Third Saturdays of each Month, 9 a.m. - 1 p.m.  [facility]: [address] [place], [place] [address] Phone: [phone] Hours: Tuesday, 4 p.m. - 8 p.m. Thursday, 1 p.m. - 8 p.m. Wednesday, 9 a.m. - [facility] [address], [place], [place] [address] Phone: [phone] Pharmacy Phone Number: [phone] Dental Phone Number: [phone] [facility] Insurance Help: [phone]  Dental Hours: Monday - Thursday, 8 a.m. - 6 p.m.  [facility] [address] [place], [place] [address] Phone:  [phone] Pharmacy Phone Number: [phone] [facility] Insurance Help: [phone]  [facility] [address] [place] [place], [place] [address] Phone: [phone] Pharmacy Phone Number: [phone] [facility] Insurance Help: [phone]  [facility] [address] [place], [place] [address] Phone: [phone] [facility] Insurance Help: [phone]   [facility] [address], [place], [place] [address] Phone: [phone]  Go to www.goodrx.com  or www.costplusdrugs.com to look up your medications. This will give you a list of where you can find your prescriptions at the most affordable prices. Or ask the pharmacist what the cash price is, or if they have any other discount programs available to help make your medication more affordable. This can be less expensive than what you would pay with insurance.

## 2021-05-25 NOTE — ED Provider Notes (Signed)
HPI  SUBJECTIVE:  Leslie Berg is a 55 y.o. female who presents with 2 days of body aches, chest tightness, cough productive of brown-reddish mucus and fatigue.  She reports chills, nasal congestion, rhinorrhea, postnasal drip, sore throat, wheezing, shortness of breath, abdominal soreness secondary to the cough.  No known fevers at home, but she did not measure her temperature.  No headaches, loss of sense of smell or taste, nausea, vomiting, diarrhea.  She states that her son had similar symptoms last week and was treated empirically for the flu.  No known COVID or RSV exposure.  She got the second dose of the COVID-vaccine.  She has not yet gotten this years flu vaccine.  She states that she is unable to sleep at night secondary to the cough.  No Antipyretic in the past 6 hours.  She has tried Mucinex and ibuprofen with improvement in her symptoms.  Symptoms are worse with movement.  She had COVID in January 22, and has a provisional diagnosis of asthma.  PMD: None.    Past Medical History:  Diagnosis Date   GERD (gastroesophageal reflux disease)    Hiatal hernia    IDA (iron deficiency anemia)    iron infusion quarterly   Leukopenia    Polycystic ovarian disease    Shortness of breath dyspnea    Vitamin B 12 deficiency     Past Surgical History:  Procedure Laterality Date   BONE MARROW BIOPSY  01/29/2008   CESAREAN SECTION  2003, 2007   x 2   COLONOSCOPY  2009   CYSTOSCOPY  12/09/2015   Procedure: CYSTOSCOPY;  Surgeon: Honor Loh Ward, MD;  Location: [facility];  Service: Gynecology;;   ESOPHAGOGASTRODUODENOSCOPY  2009   LAPAROSCOPIC HYSTERECTOMY Bilateral 12/09/2015   Procedure: HYSTERECTOMY TOTAL LAPAROSCOPIC/ BILATERAL SALPINGECTOMY;  Surgeon: Honor Loh Ward, MD;  Location: [facility];  Service: Gynecology;  Laterality: Bilateral;   TMJ ARTHROSCOPY  1995   TONSILLECTOMY      Family History  Problem Relation Age of Onset   CAD Unknown    Ulcers Unknown    Colon polyps Father     Colon polyps Mother     Social History   Tobacco Use   Smoking status: Never   Smokeless tobacco: Never  Substance Use Topics   Alcohol use: No    Alcohol/week: 0.0 standard drinks   Drug use: No    No current facility-administered medications for this encounter.  Current Outpatient Medications:    albuterol (VENTOLIN HFA) 108 (90 Base) MCG/ACT inhaler, Inhale 1-2 puffs into the lungs every 4 (four) hours as needed for wheezing or shortness of breath., Disp: 1 each, Rfl: 0   chlorpheniramine-HYDROcodone (TUSSIONEX PENNKINETIC ER) 10-8 MG/5ML SUER, Take 5 mLs by mouth every 12 (twelve) hours as needed for cough., Disp: 60 mL, Rfl: 0   ibuprofen (ADVIL) 600 MG tablet, Take 1 tablet (600 mg total) by mouth every 6 (six) hours as needed., Disp: 30 tablet, Rfl: 0   omeprazole (PRILOSEC) 40 MG capsule, Take by mouth., Disp: , Rfl:    Spacer/Aero-Holding Chambers (AEROCHAMBER PLUS) inhaler, Use with inhaler, Disp: 1 each, Rfl: 2  Allergies  Allergen Reactions   Allegra-D [Fexofenadine-Pseudoephed Er] Palpitations   Sulfa Antibiotics Rash     ROS  As noted in HPI.   Physical Exam  BP (!) 165/79 (BP Location: Left Arm)   Pulse 97   Temp (!) 103 F (39.4 C) (Oral)   Resp 18   Ht _0  (  1.549 m)   Wt 104.3 kg   LMP 11/29/2015   SpO2 95%   BMI 43.46 kg/m   Constitutional: Well developed, well nourished, no acute distress Eyes:  EOMI, conjunctiva normal bilaterally HENT: Normocephalic, atraumatic,mucus membranes moist.  No nasal congestion.  No maxillary, frontal sinus tenderness.   Neck: No cervical lymphadenopathy Respiratory: Normal inspiratory effort, poor to fair air movement, faint expiratory wheezing. Cardiovascular: Normal rate, regular rhythm, no murmurs rubs or gallops. GI: nondistended skin: No rash, skin intact Musculoskeletal: no deformities Neurologic: Alert & oriented x 3, no focal neuro deficits Psychiatric: Speech and behavior appropriate   ED  Course   Medications  acetaminophen (TYLENOL) tablet 1,000 mg (1,000 mg Oral Given 05/25/21 1357)  ibuprofen (ADVIL) tablet 800 mg (800 mg Oral Given 05/25/21 1357)    Orders Placed This Encounter  Procedures   Rapid Influenza A&B Antigens    Standing Status:   Standing    Number of Occurrences:   1   Resp Panel by RT-PCR (Flu A&B, Covid) Nasopharyngeal Swab    Standing Status:   Standing    Number of Occurrences:   1   DG Chest 2 View    Standing Status:   Standing    Number of Occurrences:   1    Order Specific Question:   Reason for Exam (SYMPTOM  OR DIAGNOSIS REQUIRED)    Answer:   Fever, cough, tenderness left lower chest rule out pneumonia   Airborne and Contact precautions    Standing Status:   Standing    Number of Occurrences:   1    Results for orders placed or performed during the hospital encounter of 05/25/21 (from the past 24 hour(s))  Rapid Influenza A&B Antigens     Status: None   Collection Time: 05/25/21  1:34 PM   Specimen: Respiratory  Result Value Ref Range   Influenza A ([facility]) NEGATIVE NEGATIVE   Influenza B ([facility]) NEGATIVE NEGATIVE  Resp Panel by RT-PCR (Flu A&B, Covid) Nasopharyngeal Swab     Status: Abnormal   Collection Time: 05/25/21  2:07 PM   Specimen: Nasopharyngeal Swab; Nasopharyngeal(NP) swabs in vial transport medium  Result Value Ref Range   SARS Coronavirus 2 by RT PCR NEGATIVE NEGATIVE   Influenza A by PCR POSITIVE (A) NEGATIVE   Influenza B by PCR NEGATIVE NEGATIVE   DG Chest 2 View  Result Date: 05/25/2021 CLINICAL DATA:  Cough, fever EXAM: CHEST - 2 VIEW COMPARISON:  06/22/2014 FINDINGS: The heart size and mediastinal contours are within normal limits. No focal airspace consolidation, pleural effusion, or pneumothorax. The visualized skeletal structures are unremarkable. IMPRESSION: No active cardiopulmonary disease. Electronically Signed   By: Davina Poke D.O.   On: 05/25/2021 14:24    ED Clinical Impression  1. Influenza A    2. Encounter for laboratory testing for COVID-19 virus      ED Assessment/Plan  Patient has an acute illness with systemic symptoms of fever.  Patient was given Tylenol and ibuprofen for the fever.  While her lungs are tight, she is in no respiratory distress.  Rapid flu negative, will check COVID, flu PCR.  Today's last day that she can be started on Tamiflu.  will also check chest x-ray in the meantime.  if COVID and flu are negative, will treat as bacterial pneumonia.  CXR independently reviewed.  No pneumonia.  See radiology report for details.   [place] [employer] database reviewed for this patient, and feel that the risk/benefit ratio today  is favorable for proceeding with a prescription for controlled substance.  1 opiate prescription in the past 2 years-for Tussionex.  Will send home with Tussionex, continue Mucinex, Tylenol/ibuprofen, regularly scheduled albuterol inhaler with a spacer.  Plan to base further treatment off of triplex PCR.  We will contact patient at [phone] if either 1 of these are positive and will prescribe the appropriate medication.  Qualifies for Molnupiravir based on history of asthma, BMI above 30.  PCR positive for influenza A.  Prescribing Tamiflu.  Contacted patient to let her know of positive test result and the prescription waiting for her.   Discussed labs, imaging, MDM, treatment plan, and plan for follow-up with patient. Discussed sn/sx that should prompt return to the ED. patient agrees with plan.   Meds ordered this encounter  Medications   acetaminophen (TYLENOL) tablet 1,000 mg   ibuprofen (ADVIL) tablet 800 mg   chlorpheniramine-HYDROcodone (TUSSIONEX PENNKINETIC ER) 10-8 MG/5ML SUER    Sig: Take 5 mLs by mouth every 12 (twelve) hours as needed for cough.    Dispense:  60 mL    Refill:  0   albuterol (VENTOLIN HFA) 108 (90 Base) MCG/ACT inhaler    Sig: Inhale 1-2 puffs into the lungs every 4 (four) hours as needed for wheezing or shortness of  breath.    Dispense:  1 each    Refill:  0   Spacer/Aero-Holding Chambers (AEROCHAMBER PLUS) inhaler    Sig: Use with inhaler    Dispense:  1 each    Refill:  2    Please educate patient on use   ibuprofen (ADVIL) 600 MG tablet    Sig: Take 1 tablet (600 mg total) by mouth every 6 (six) hours as needed.    Dispense:  30 tablet    Refill:  0      *This clinic note was created using [employer]. Therefore, there may be occasional mistakes despite careful proofreading.  ?    Melynda Ripple, MD 05/26/21 [phone]

## 2021-05-25 NOTE — ED Triage Notes (Signed)
Pt here with C/O cough with chest tightness, fever, body aches that started last night.

## 2021-05-25 NOTE — Telephone Encounter (Signed)
[facility] out of Tamiflu and Tussionex.  Sending prescriptions to [facility] [place].

## 2021-05-27 ENCOUNTER — Telehealth: Payer: Self-pay | Admitting: Physician Assistant

## 2021-05-27 ENCOUNTER — Telehealth: Payer: Self-pay | Admitting: Emergency Medicine

## 2021-05-27 DIAGNOSIS — J101 Influenza due to other identified influenza virus with other respiratory manifestations: Secondary | ICD-10-CM

## 2021-05-27 MED ORDER — HYDROCOD POLST-CPM POLST ER 10-8 MG/5ML PO SUER: 5.0000 mL | Freq: Two times a day (BID) | ORAL | 0 refills | Status: DC | PRN

## 2021-05-27 NOTE — Telephone Encounter (Signed)
Pt pharmacy called and they did not have enough of the cough meds  to dispense. W[facility] to [facility].

## 2021-05-27 NOTE — Telephone Encounter (Signed)
[facility] called stating they did not have enough Tussionex. Sent Tussionex (as originally prescribed by Dr. Chaney Malling) [facility]

## 2022-04-25 IMAGING — CR DG CHEST 2V
2 series · 2 of 2 positions shown · non-contrast
Comparison: 06/22/2014

CLINICAL DATA: Cough, fever

EXAM:
CHEST - 2 VIEW

[chest pa]
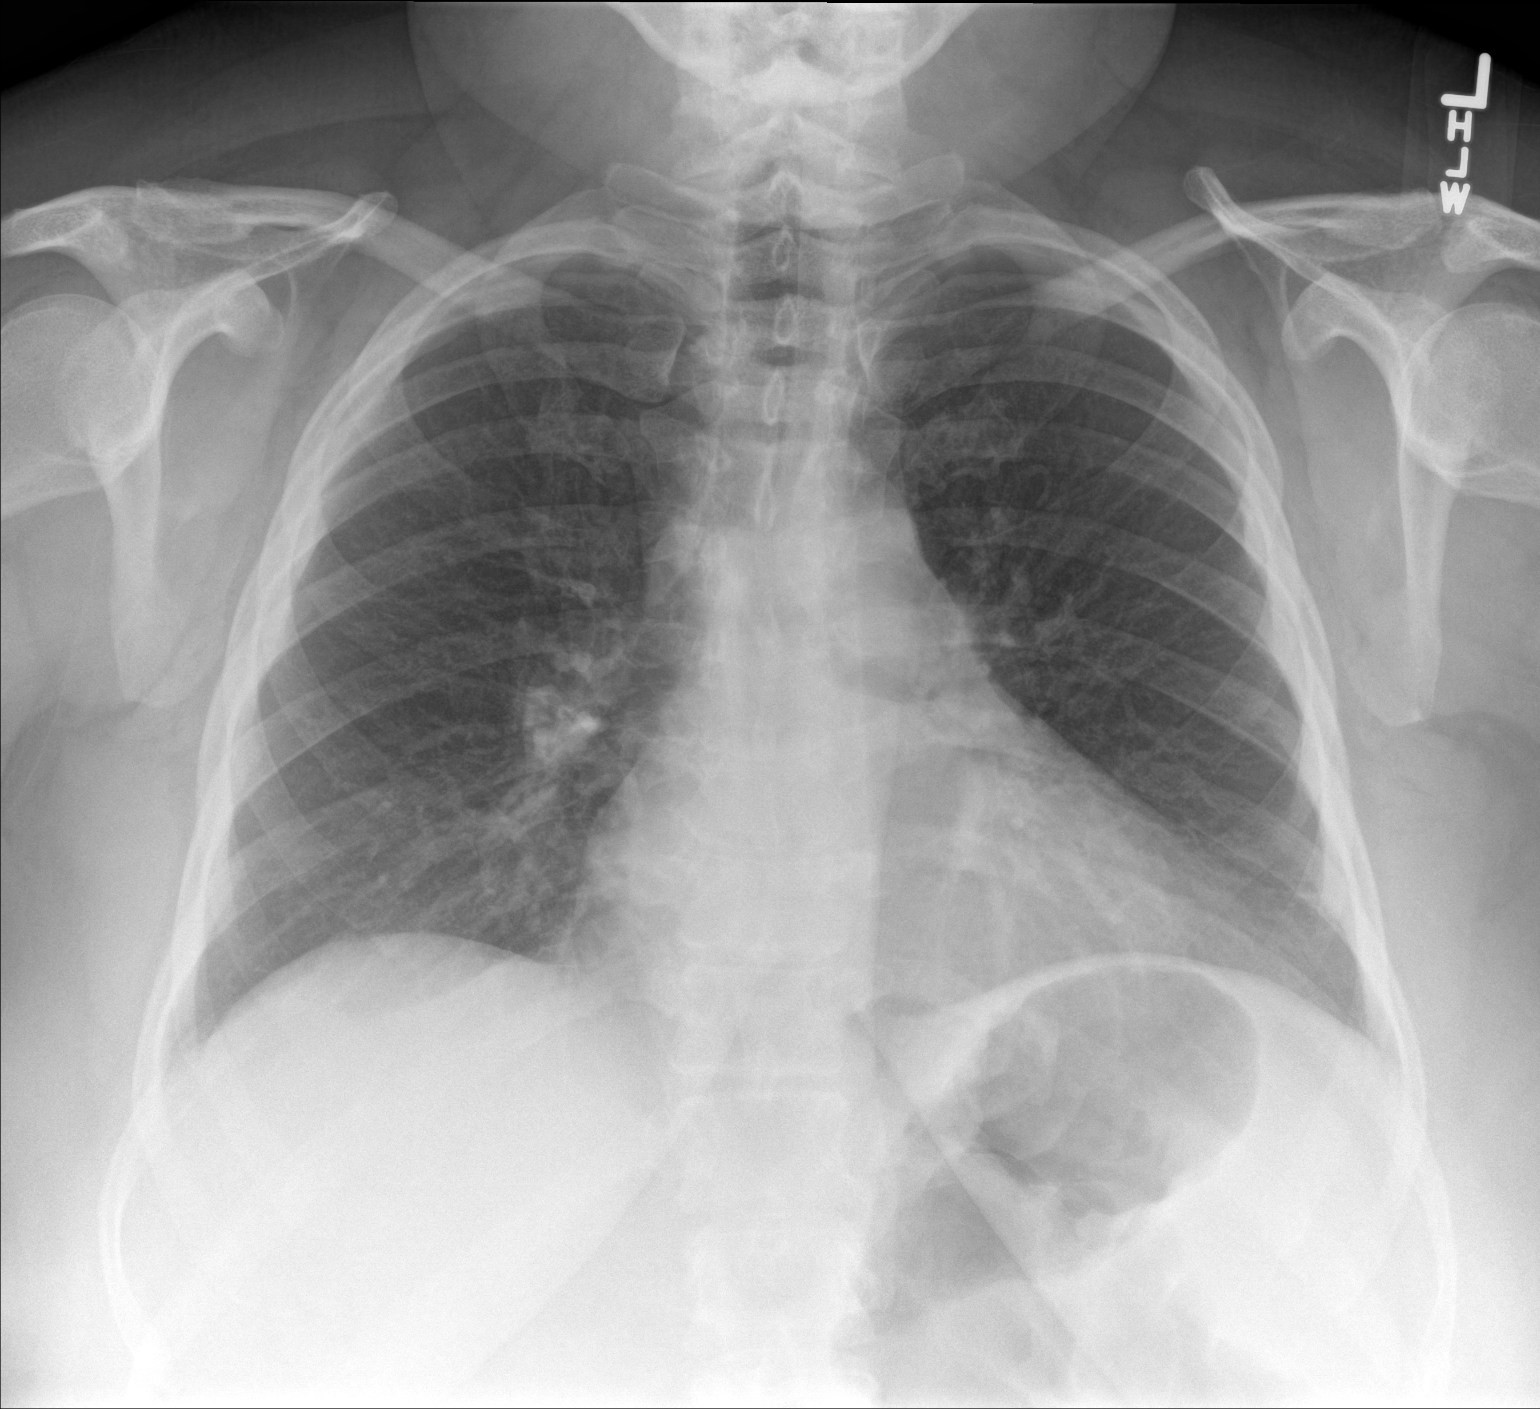

[chest lat]
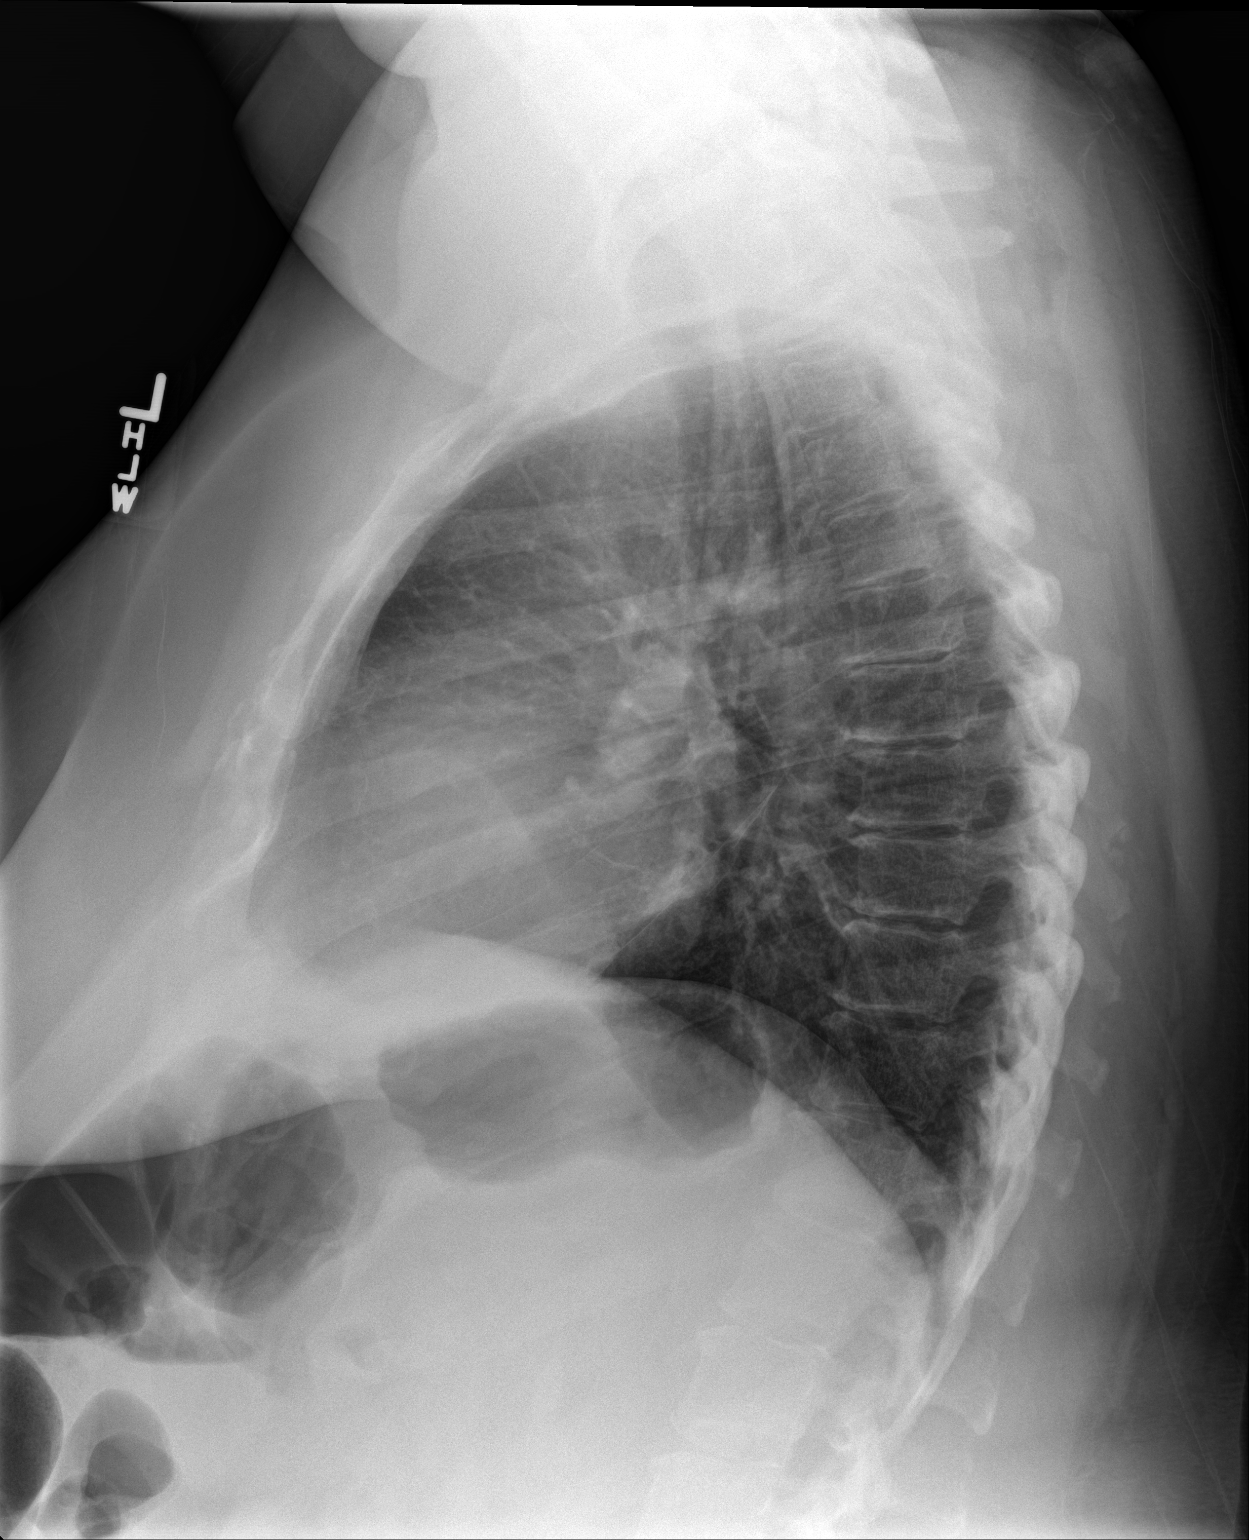

[2 of 2 positions shown; findings below may reference images not displayed]

FINDINGS: The heart size and mediastinal contours are within normal limits. No
focal airspace consolidation, pleural effusion, or pneumothorax. The
visualized skeletal structures are unremarkable.
IMPRESSION: No active cardiopulmonary disease.

## 2023-08-26 ENCOUNTER — Encounter: Payer: Self-pay | Admitting: Internal Medicine

## 2023-08-26 ENCOUNTER — Ambulatory Visit
Admission: EM | Admit: 2023-08-26 | Discharge: 2023-08-26 | Disposition: A | Discharge status: Home / Self Care | Payer: No Typology Code available for payment source | Attending: Emergency Medicine | Admitting: Emergency Medicine

## 2023-08-26 ENCOUNTER — Encounter: Payer: Self-pay | Admitting: Emergency Medicine

## 2023-08-26 DIAGNOSIS — U071 COVID-19: Secondary | ICD-10-CM | POA: Diagnosis present

## 2023-08-26 LAB — RESP PANEL BY RT-PCR (FLU A&B, COVID) ARPGX2
Influenza A by PCR: NEGATIVE
Influenza B by PCR: NEGATIVE
SARS Coronavirus 2 by RT PCR: POSITIVE — AB

## 2023-08-26 MED ORDER — AEROCHAMBER MV MISC: 2 refills | Status: AC

## 2023-08-26 MED ORDER — IPRATROPIUM BROMIDE 0.06 % NA SOLN: 2.0000 | Freq: Four times a day (QID) | NASAL | 12 refills | Status: AC

## 2023-08-26 MED ORDER — BENZONATATE 100 MG PO CAPS
200.0000 mg | ORAL_CAPSULE | Freq: Three times a day (TID) | ORAL | 0 refills | Status: AC
Start: 2023-08-26 — End: ?

## 2023-08-26 MED ORDER — PROMETHAZINE-DM 6.25-15 MG/5ML PO SYRP: 5.0000 mL | ORAL_SOLUTION | Freq: Four times a day (QID) | ORAL | 0 refills | Status: AC | PRN

## 2023-08-26 MED ORDER — ALBUTEROL SULFATE HFA 108 (90 BASE) MCG/ACT IN AERS: 2.0000 | INHALATION_SPRAY | RESPIRATORY_TRACT | 0 refills | Status: AC | PRN

## 2023-08-26 MED ORDER — ACETAMINOPHEN 325 MG PO TABS
975.0000 mg | ORAL_TABLET | Freq: Once | ORAL | Status: AC
  Administered 2023-08-26: 975 mg via ORAL

## 2023-08-26 NOTE — ED Provider Notes (Signed)
 [facility]    CSN: [id number] Arrival date & time: 08/26/23  9072      History   Chief Complaint Chief Complaint  Patient presents with   Cough    HPI Leslie Berg is a 58 y.o. female.   HPI  58 year old female with past medical history significant for GERD, hiatal hernia, IDA, leukopenia, PCOS, shortness of breath, and vitamin B12 deficiency presents for evaluation of respiratory symptoms that began 2 days ago and consist primarily of a nonproductive cough with some occasional wheezing.  She does endorse a fever with a Tmax 100.2 as well as mild nasal congestion and runny nose, sore throat that she is attributing to her cough, and some diarrhea.  She denies any ear pain, shortness breath, nausea or vomiting, headaches, or bodyaches.  Past Medical History:  Diagnosis Date   GERD (gastroesophageal reflux disease)    Hiatal hernia    IDA (iron  deficiency anemia)    iron  infusion quarterly   Leukopenia    Polycystic ovarian disease    Shortness of breath dyspnea    Vitamin B 12 deficiency     Patient Active Problem List   Diagnosis Date Noted   Iron  deficiency anemia due to chronic blood loss 12/18/2014    Past Surgical History:  Procedure Laterality Date   BONE MARROW BIOPSY  01/29/2008   CESAREAN SECTION  2003, 2007   x 2   COLONOSCOPY  2009   CYSTOSCOPY  12/09/2015   Procedure: CYSTOSCOPY;  Surgeon: Mitzie BROCKS Ward, MD;  Location: [facility];  Service: Gynecology;;   ESOPHAGOGASTRODUODENOSCOPY  2009   LAPAROSCOPIC HYSTERECTOMY Bilateral 12/09/2015   Procedure: HYSTERECTOMY TOTAL LAPAROSCOPIC/ BILATERAL SALPINGECTOMY;  Surgeon: Mitzie BROCKS Ward, MD;  Location: [facility];  Service: Gynecology;  Laterality: Bilateral;   TMJ ARTHROSCOPY  1995   TONSILLECTOMY      OB History   No obstetric history on file.      Home Medications    Prior to Admission medications   Medication Sig Start Date End Date Taking? Authorizing Provider  albuterol  (VENTOLIN  HFA)  108 (90 Base) MCG/ACT inhaler Inhale 2 puffs into the lungs every 4 (four) hours as needed. 08/26/23  Yes Bernardino Ditch, NP  benzonatate  (TESSALON ) 100 MG capsule Take 2 capsules (200 mg total) by mouth every 8 (eight) hours. 08/26/23  Yes Bernardino Ditch, NP  ipratropium (ATROVENT ) 0.06 % nasal spray Place 2 sprays into both nostrils 4 (four) times daily. 08/26/23  Yes Bernardino Ditch, NP  promethazine -dextromethorphan (PROMETHAZINE -DM) 6.25-15 MG/5ML syrup Take 5 mLs by mouth 4 (four) times daily as needed. 08/26/23  Yes Bernardino Ditch, NP  Spacer/Aero-Holding [place] (AEROCHAMBER MV) inhaler Use as instructed 08/26/23  Yes Bernardino Ditch, NP  ibuprofen  (ADVIL ) 600 MG tablet Take 1 tablet (600 mg total) by mouth every 6 (six) hours as needed. 05/25/21   Mortenson, Ashley, MD  omeprazole (PRILOSEC) 40 MG capsule Take by mouth.    [provider]    Family History Family History  Problem Relation Age of Onset   CAD Unknown    Ulcers Unknown    Colon polyps Father    Colon polyps Mother     Social History Social History   Tobacco Use   Smoking status: Never   Smokeless tobacco: Never  Vaping Use   Vaping status: Never Used  Substance Use Topics   Alcohol use: No    Alcohol/week: 0.0 standard drinks of alcohol   Drug use: No  Allergies   Allegra-d [fexofenadine -pseudoephed er] and Sulfa antibiotics   Review of Systems Review of Systems  Constitutional:  Positive for fever.  HENT:  Positive for congestion, rhinorrhea and sore throat. Negative for ear pain.   Respiratory:  Positive for cough and wheezing. Negative for shortness of breath.   Gastrointestinal:  Positive for diarrhea. Negative for nausea and vomiting.  Musculoskeletal:  Negative for arthralgias and myalgias.  Neurological:  Negative for headaches.     Physical Exam Triage Vital Signs ED Triage Vitals  Encounter Vitals Group     BP 08/26/23 1048 (!) 157/83     Systolic BP Percentile --      Diastolic BP  Percentile --      Pulse Rate 08/26/23 1048 85     Resp 08/26/23 1048 15     Temp 08/26/23 1048 (!) 100.6 F (38.1 C)     Temp Source 08/26/23 1048 Oral     SpO2 08/26/23 1048 95 %     Weight 08/26/23 1046 229 lb 15 oz (104.3 kg)     Height 08/26/23 1046 5' 1 (1.549 m)     Head Circumference --      Peak Flow --      Pain Score 08/26/23 1046 0     Pain Loc --      Pain Education --      Exclude from Growth Chart --    No data found.  Updated Vital Signs BP (!) 157/83 (BP Location: Right Arm)   Pulse 85   Temp (!) 100.6 F (38.1 C) (Oral) Comment: Patient did not want any fever reducer medicine at this time  Resp 15   Ht 5' 1 (1.549 m)   Wt 229 lb 15 oz (104.3 kg)   LMP 11/22/2015   SpO2 95%   BMI 43.45 kg/m   Visual Acuity Right Eye Distance:   Left Eye Distance:   Bilateral Distance:    Right Eye Near:   Left Eye Near:    Bilateral Near:     Physical Exam Vitals and nursing note reviewed.  Constitutional:      Appearance: Normal appearance. She is not ill-appearing.  HENT:     Head: Normocephalic and atraumatic.     Right Ear: Tympanic membrane, ear canal and external ear normal. There is no impacted cerumen.     Left Ear: Tympanic membrane, ear canal and external ear normal. There is no impacted cerumen.     Nose: Congestion present. No rhinorrhea.     Comments: Nasal mucosa is mildly edematous but free of erythema or discharge.    Mouth/Throat:     Mouth: Mucous membranes are moist.     Pharynx: Oropharynx is clear. Posterior oropharyngeal erythema present. No oropharyngeal exudate.     Comments: Mild erythema to the posterior pharynx with clear postnasal drip.  Tonsillar pillars are unremarkable. Cardiovascular:     Rate and Rhythm: Normal rate and regular rhythm.     Pulses: Normal pulses.     Heart sounds: Normal heart sounds. No murmur heard.    No friction rub. No gallop.  Pulmonary:     Effort: Pulmonary effort is normal.     Breath sounds:  Normal breath sounds. No wheezing, rhonchi or rales.  Musculoskeletal:     Cervical back: Normal range of motion and neck supple. No tenderness.  Lymphadenopathy:     Cervical: No cervical adenopathy.  Skin:    General: Skin is warm and dry.  Capillary Refill: Capillary refill takes less than 2 seconds.     Findings: No erythema or rash.  Neurological:     General: No focal deficit present.     Mental Status: She is alert and oriented to person, place, and time.      UC Treatments / Results  Labs (all labs ordered are listed, but only abnormal results are displayed) Labs Reviewed  RESP PANEL BY RT-PCR (FLU A&B, COVID) ARPGX2 - Abnormal; Notable for the following components:      Result Value   SARS Coronavirus 2 by RT PCR POSITIVE (*)    All other components within normal limits    EKG   Radiology No results found.  Procedures Procedures (including critical care time)  Medications Ordered in UC Medications  acetaminophen  (TYLENOL ) tablet 975 mg (has no administration in time range)    Initial Impression / Assessment and Plan / UC Course  I have reviewed the triage vital signs and the nursing notes.  Pertinent labs & imaging results that were available during my care of the patient were reviewed by me and considered in my medical decision making (see chart for details).   Patient is a nontoxic-appearing 58 year old female presenting for evaluation of 2 days with respiratory symptoms as outlined HPI above.  Her exam reveals mild inflammation of her upper respiratory tract without appreciable rhinorrhea.  There is mild erythema to the posterior oropharynx with clear postnasal drip.  Cardiopulmonary exam reveals: Sounds in all fields.  Differential diagnosis include COVID, influenza, viral respiratory illness.  I will order a COVID and influenza PCR.  I will also order 975 Tylenol  as patient has fever in triage of 100.6.  Respiratory panel is positive for COVID.  I  will discharge patient with diagnosis of COVID and 19 with prescriptions for Atrovent  Nasabid help with her nasal congestion with Tessalon  Perles and Promethazine  DM cough syrup for cough and congestion.  I will also prescribe an albuterol  inhaler and spacer that she can use every 4-6 hours as needed for wheezing.  Given the mild nature of her disease I do not feel that antivirals are necessary at this time.   Final Clinical Impressions(s) / UC Diagnoses   Final diagnoses:  COVID-19     Discharge Instructions      CDC guidelines state that you must wear a mask for the first 5 days of symptoms when you are around other people.  After 5 days you no longer need to mask as you are no longer considered infectious.  There is no longer need to quarantine unless you have a fever.  If you do have a fever then you need to quarantine until you have been fever free for 24 hours without taking Tylenol  and/or ibuprofen .  Use over-the-counter Tylenol  and/or ibuprofen  according to the package instructions as needed for fever and pain.  Use the Atrovent  nasal spray, 2 squirts up each nostril every 6 hours, as needed for nasal congestion and runny nose.  Use the Tessalon  Perles every 8 hours during the day as needed for cough.  Take them with a small sip of water.  You may experience numbness to the base of your tongue or metallic taste in her mouth, this is normal.  Use the Promethazine  DM cough syrup at bedtime as needed for cough and congestion.  Be mindful this medication will make you sleepy.  Use the albuterol  inhaler, with the spacer, 1 to 2 puffs every 4-6 hours as needed for any shortness  of breath or wheezing.  If you develop any worsening respiratory symptoms such as shortness of breath, shortness of breath at rest, feel as though you cannot catch your breath, you are unable to speak in full sentences, or, as a late sign, your lips begin turning blue you need to call 911 and go to the ER for  evaluation.      ED Prescriptions     Medication Sig Dispense Auth. Provider   Spacer/Aero-Holding Chambers (AEROCHAMBER MV) inhaler Use as instructed 1 each Bernardino Ditch, NP   albuterol  (VENTOLIN  HFA) 108 (90 Base) MCG/ACT inhaler Inhale 2 puffs into the lungs every 4 (four) hours as needed. 18 g Bernardino Ditch, NP   benzonatate  (TESSALON ) 100 MG capsule Take 2 capsules (200 mg total) by mouth every 8 (eight) hours. 21 capsule Bernardino Ditch, NP   ipratropium (ATROVENT ) 0.06 % nasal spray Place 2 sprays into both nostrils 4 (four) times daily. 15 mL Bernardino Ditch, NP   promethazine -dextromethorphan (PROMETHAZINE -DM) 6.25-15 MG/5ML syrup Take 5 mLs by mouth 4 (four) times daily as needed. 118 mL Bernardino Ditch, NP      PDMP not reviewed this encounter.   Bernardino Ditch, NP 08/26/23 [phone]

## 2023-08-26 NOTE — ED Triage Notes (Signed)
 Patient c/o cough and chest congestion that started 2 days ago.  Patient reports low grade fever yesterday.

## 2023-08-26 NOTE — Discharge Instructions (Signed)
 CDC guidelines state that you must wear a mask for the first 5 days of symptoms when you are around other people.  After 5 days you no longer need to mask as you are no longer considered infectious.  There is no longer need to quarantine unless you have a fever.  If you do have a fever then you need to quarantine until you have been fever free for 24 hours without taking Tylenol  and/or ibuprofen .  Use over-the-counter Tylenol  and/or ibuprofen  according to the package instructions as needed for fever and pain.  Use the Atrovent  nasal spray, 2 squirts up each nostril every 6 hours, as needed for nasal congestion and runny nose.  Use the Tessalon  Perles every 8 hours during the day as needed for cough.  Take them with a small sip of water.  You may experience numbness to the base of your tongue or metallic taste in her mouth, this is normal.  Use the Promethazine  DM cough syrup at bedtime as needed for cough and congestion.  Be mindful this medication will make you sleepy.  Use the albuterol  inhaler, with the spacer, 1 to 2 puffs every 4-6 hours as needed for any shortness of breath or wheezing.  If you develop any worsening respiratory symptoms such as shortness of breath, shortness of breath at rest, feel as though you cannot catch your breath, you are unable to speak in full sentences, or, as a late sign, your lips begin turning blue you need to call 911 and go to the ER for evaluation.
